# Patient Record
Sex: Female | Born: 1940 | Race: White | Hispanic: No | Marital: Single | Smoking: Former smoker
Health system: Southern US, Community
[De-identification: ages and names within clinical notes are randomized; demographics above are authoritative.]

## PROBLEM LIST (undated history)

## (undated) DIAGNOSIS — E785 Hyperlipidemia, unspecified: Secondary | ICD-10-CM

## (undated) DIAGNOSIS — I10 Essential (primary) hypertension: Secondary | ICD-10-CM

## (undated) DIAGNOSIS — Z9889 Other specified postprocedural states: Secondary | ICD-10-CM

## (undated) DIAGNOSIS — E119 Type 2 diabetes mellitus without complications: Secondary | ICD-10-CM

## (undated) DIAGNOSIS — C801 Malignant (primary) neoplasm, unspecified: Secondary | ICD-10-CM

## (undated) DIAGNOSIS — G4733 Obstructive sleep apnea (adult) (pediatric): Secondary | ICD-10-CM

## (undated) DIAGNOSIS — M1990 Unspecified osteoarthritis, unspecified site: Secondary | ICD-10-CM

## (undated) DIAGNOSIS — R112 Nausea with vomiting, unspecified: Secondary | ICD-10-CM

## (undated) DIAGNOSIS — I4891 Unspecified atrial fibrillation: Secondary | ICD-10-CM

## (undated) HISTORY — DX: Unspecified atrial fibrillation: I48.91

## (undated) HISTORY — DX: Essential (primary) hypertension: I10

## (undated) HISTORY — DX: Type 2 diabetes mellitus without complications: E11.9

## (undated) HISTORY — DX: Hyperlipidemia, unspecified: E78.5

## (undated) HISTORY — DX: Obstructive sleep apnea (adult) (pediatric): G47.33

---

## 1963-10-17 HISTORY — PX: APPENDECTOMY: SHX54

## 1990-10-16 HISTORY — PX: LUMBAR SPINE SURGERY: SHX701

## 2008-10-16 HISTORY — PX: CERVICAL SPINE SURGERY: SHX589

## 2013-10-16 DIAGNOSIS — I4891 Unspecified atrial fibrillation: Secondary | ICD-10-CM

## 2013-10-16 HISTORY — DX: Unspecified atrial fibrillation: I48.91

## 2015-10-17 DIAGNOSIS — G4733 Obstructive sleep apnea (adult) (pediatric): Secondary | ICD-10-CM

## 2015-10-17 HISTORY — DX: Obstructive sleep apnea (adult) (pediatric): G47.33

## 2018-06-03 ENCOUNTER — Ambulatory Visit (INDEPENDENT_AMBULATORY_CARE_PROVIDER_SITE_OTHER): Payer: Medicare Other | Admitting: Cardiovascular Disease

## 2018-06-03 ENCOUNTER — Encounter: Payer: Self-pay | Admitting: Cardiovascular Disease

## 2018-06-03 VITALS — BP 118/83 | HR 92 | Ht 67.0 in | Wt 216.8 lb

## 2018-06-03 DIAGNOSIS — E782 Mixed hyperlipidemia: Secondary | ICD-10-CM | POA: Diagnosis not present

## 2018-06-03 DIAGNOSIS — I481 Persistent atrial fibrillation: Secondary | ICD-10-CM | POA: Diagnosis not present

## 2018-06-03 DIAGNOSIS — I4811 Longstanding persistent atrial fibrillation: Secondary | ICD-10-CM

## 2018-06-03 DIAGNOSIS — E1169 Type 2 diabetes mellitus with other specified complication: Secondary | ICD-10-CM

## 2018-06-03 DIAGNOSIS — G4733 Obstructive sleep apnea (adult) (pediatric): Secondary | ICD-10-CM

## 2018-06-03 DIAGNOSIS — I10 Essential (primary) hypertension: Secondary | ICD-10-CM | POA: Diagnosis not present

## 2018-06-03 DIAGNOSIS — E669 Obesity, unspecified: Secondary | ICD-10-CM

## 2018-06-03 NOTE — Patient Instructions (Addendum)
Dr Sallyanne Kuster recommends that you schedule a follow-up appointment in 12 months. You will receive a reminder letter in the mail two months in advance. If you don't receive a letter, please call our office to schedule the follow-up appointment.  If you need a refill on your cardiac medications before your next appointment, please call your pharmacy.   Your physician recommends that you schedule a follow-up appointment with Dr Claiborne Billings first available [place].

## 2018-06-04 ENCOUNTER — Encounter: Payer: Self-pay | Admitting: Cardiovascular Disease

## 2018-06-04 DIAGNOSIS — E782 Mixed hyperlipidemia: Secondary | ICD-10-CM | POA: Insufficient documentation

## 2018-06-04 DIAGNOSIS — E1169 Type 2 diabetes mellitus with other specified complication: Secondary | ICD-10-CM | POA: Insufficient documentation

## 2018-06-04 DIAGNOSIS — G4733 Obstructive sleep apnea (adult) (pediatric): Secondary | ICD-10-CM | POA: Insufficient documentation

## 2018-06-04 DIAGNOSIS — E669 Obesity, unspecified: Secondary | ICD-10-CM

## 2018-06-04 DIAGNOSIS — I10 Essential (primary) hypertension: Secondary | ICD-10-CM | POA: Insufficient documentation

## 2018-06-04 DIAGNOSIS — I4811 Longstanding persistent atrial fibrillation: Secondary | ICD-10-CM | POA: Insufficient documentation

## 2018-06-04 NOTE — Progress Notes (Signed)
Cardiology Office Note:    Date:  06/04/2018   ID:  Colleen Mcgrath, DOB 04/13/1941, MRN [id number]  PCP:  Beckie Salts, MD  Cardiologist: New  Referring MD: No ref. provider found   Chief Complaint  Patient presents with  . Atrial Fibrillation  Establish cardiology care after moving to [facility]  History of Present Illness:    Colleen Mcgrath is a 77 y.o. female with a hx of long-standing persistent atrial fibrillation, obstructive sleep apnea, hypertension with left ventricular hypertrophy, mixed hyperlipidemia, type 2 diabetes mellitus, obesity, who recently moved here from [place].  We have not received her medical records, but cardiology results and office visit notes are available via care everywhere.  The cardiologist in [place] was Dr. Geralyn Corwin with [facility]  Her last office visit with him was in January 2019.  She was initially diagnosed with atrial fibrillation in 2015.  2 attempts at cardioversion were performed, both followed by early recurrence of arrhythmia, after which the decision was made to manage her with rate control alone.  She has been compliant with anticoagulation has not had bleeding problems.  She does not have a history of stroke, TIA or other embolic events.    - Echocardiogram in August 2018 showed preserved biventricular function, LVEF 60%, mild LVH, mild left atrial enlargement, "compared to the prior report, the MR is less" (prior report showed 1-2+ MR).  Carlton Adam Myoview performed October 2018 showed a very small and fixed distal anterior wall/apical defect that was felt to possibly represent attenuation artifact.  No reversible ischemia seen.   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 -Polysomnogram 2017 (2 studies in August and December, the actual report not available for  review).  The patient specifically denies any chest pain at rest or with exertion, dyspnea at rest or with exertion, orthopnea, paroxysmal nocturnal dyspnea, syncope, palpitations, focal neurological deficits, intermittent claudication, lower extremity edema, unexplained weight gain, cough, hemoptysis or wheezing.  The patient also denies abdominal pain, nausea, vomiting, dysphagia, diarrhea, constipation, polyuria, polydipsia, dysuria, hematuria, frequency, urgency, abnormal bleeding or bruising, fever, chills, unexpected weight changes, mood swings, change in skin or hair texture, change in voice quality, auditory or visual problems, allergic reactions or rashes, new musculoskeletal complaints other than usual "aches and pains".  She never became comfortable with wearing CPAP, although she does have the equipment.  She would like to focus on treatment for sleep apnea.  She does complain of fatigue, but it's not so clear that she has severe daytime hypersomnolence.  Past Medical History:  Diagnosis Date  . Atrial fibrillation ([place]) 2015  . Diabetes mellitus without complication ([place])   . Hyperlipidemia   . Hypertension   . OSA (obstructive sleep apnea) 2017    Past Surgical History:  Procedure Laterality Date  . APPENDECTOMY  1965  . CERVICAL SPINE SURGERY  2010  . LUMBAR SPINE SURGERY  1992    Current Medications: Current Meds  Medication Sig  . atenolol (TENORMIN) 50 MG tablet Take by mouth.  . Cholecalciferol (VITAMIN D-1000 MAX ST) 1000 units tablet Take by mouth.  Marland Kitchen glucose blood (CONTOUR TEST) test strip TEST ONCE DAILY  . Insulin Detemir (LEVEMIR FLEXTOUCH) 100 UNIT/ML Pen Inject into the skin.  . Insulin Pen Needle (BD PEN NEEDLE NANO U/F) 32G X 4 MM MISC Use 1 pen  needle daily.E11.9  . lisinopril (PRINIVIL,ZESTRIL) 5 MG tablet Take by mouth.  . loratadine (CLARITIN) 10 MG tablet Take by mouth.  . metoprolol tartrate (LOPRESSOR) 50 MG tablet Take by mouth.  . rivaroxaban  (XARELTO) 20 MG TABS tablet Take by mouth.     Allergies:   Cephalexin; Colesevelam hcl; Ezetimibe; Statins; Sulfa antibiotics; Cephalosporins; and Tetracyclines & related   Social History   Socioeconomic History  . Marital status: Single    Spouse name: Not on file  . Number of children: 2  . Years of education: 91  . Highest education level: Not on file  Occupational History  . Not on file  Social Needs  . Financial resource strain: Not on file  . Food insecurity:    Worry: Not on file    Inability: Not on file  . Transportation needs:    Medical: Not on file    Non-medical: Not on file  Tobacco Use  . Smoking status: Former Smoker    Last attempt to quit: 1999    Years since quitting: 20.6  . Smokeless tobacco: Never Used  Substance and Sexual Activity  . Alcohol use: Never    Frequency: Never  . Drug use: Never  . Sexual activity: Not on file  Lifestyle  . Physical activity:    Days per week: Not on file    Minutes per session: Not on file  . Stress: Not on file  Relationships  . Social connections:    Talks on phone: Not on file    Gets together: Not on file    Attends religious service: Not on file    Active member of club or organization: Not on file    Attends meetings of clubs or organizations: Not on file    Relationship status: Not on file  Other Topics Concern  . Not on file  Social History Narrative  . Not on file     Family History: The patient's family history includes Dementia in her mother; Heart disease in her father.  Her dad had onset of coronary problems at age 88, had bypass surgery, died 10 days later.  Her mother died of "old age" at 89  ROS:   Please see the history of present illness.     All other systems reviewed and are negative.  EKGs/Labs/Other Studies Reviewed:    The following studies were reviewed today: Multiple records including office visits, echo report, nuclear report, brain MRI, labs.  EKG:  EKG is  ordered today.   The ekg ordered today demonstrates atrial fibrillation with controlled ventricular response, otherwise normal tracing  Recent Labs: July 2019 hemoglobin A1c was 7.4%, creatinine 0.97, potassium 4.6, CO2 20, albumin 4.3, direct LDL 179 Recent Lipid Panel November 2018 total cholesterol 204, triglycerides 221, HDL 36, LDL 124  Physical Exam:    VS:  BP 118/83 (BP Location: Right Arm, Patient Position: Sitting, Cuff Size: Large)   Pulse 92   Ht 5\' 7"  (1.702 m)   Wt 216 lb 12.8 oz (98.3 kg)   BMI 33.96 kg/m     Wt Readings from Last 3 Encounters:  06/03/18 216 lb 12.8 oz (98.3 kg)     GEN: Mildly obese well nourished, well developed in no acute distress HEENT: Normal NECK: No JVD; No carotid bruits LYMPHATICS: No lymphadenopathy CARDIAC: Irregular, no murmurs, rubs, gallops RESPIRATORY:  Clear to auscultation without rales, wheezing or rhonchi  ABDOMEN: Soft, non-tender, non-distended MUSCULOSKELETAL:  No edema; No deformity  SKIN: Warm  and dry NEUROLOGIC:  Alert and oriented x 3 PSYCHIATRIC:  Normal affect   ASSESSMENT:    1. Longstanding persistent atrial fibrillation ([place])   2. Essential hypertension   3. Diabetes mellitus type 2 in obese ([place])   4. Mixed hyperlipidemia   5. OSA (obstructive sleep apnea)    PLAN:    In order of problems listed above:  1. AFib: Permanent arrhythmia and asymptomatic.  Well rate controlled on beta-blocker.  Appropriately anticoagulated. CHADSVasc 4 (age, gender, hypertension, diabetes).  No known history of embolic events.  Tolerating anticoagulation without bleeding events. 2. HTN: Well-controlled.  Will always need to be on beta-blocker for rate control he should be on ARB for diabetes. 3. DM: Improving control, although not quite yet at target. 4. HLP: She tells me that "she has been tried on every statin available".  Her LDL cholesterol is unacceptably high and she is at high risk for future cardiovascular complications based on the  data available so far.  On the other hand she tells me that she had some type of a cardiac scan with a score of 0, presumably a calcium score.  She does not recall exactly when this was done, but is probably been a long time.  We briefly discussed PCSK9 inhibitors, which have recently been approved for primary prevention.  I would strongly recommend she start these if she has even the slightest vascular event and will revisit the issue of primary prevention with her after we have a little more data.. 5. OSA: She had an abnormal polysomnogram but it sounds like her CPAP has never been adequately treated/titrated.  I recommended follow-up in sleep clinic with Dr. Claiborne Billings.  Weight loss is encouraged.   Medication Adjustments/Labs and Tests Ordered: Current medicines are reviewed at length with the patient today.  Concerns regarding medicines are outlined above.  No orders of the defined types were placed in this encounter.  No orders of the defined types were placed in this encounter.   Patient Instructions  Dr Sallyanne Kuster recommends that you schedule a follow-up appointment in 12 months. You will receive a reminder letter in the mail two months in advance. If you don't receive a letter, please call our office to schedule the follow-up appointment.  If you need a refill on your cardiac medications before your next appointment, please call your pharmacy.   Your physician recommends that you schedule a follow-up appointment with Dr Claiborne Billings first available [place].    Signed, Sanda Klein, MD  06/04/2018 7:21 PM    [place]

## 2018-07-04 ENCOUNTER — Telehealth: Payer: Self-pay | Admitting: Cardiovascular Disease

## 2018-07-04 NOTE — Telephone Encounter (Signed)
Received records from [facility] on 07/04/18, Appt 09/02/18 @ 10:40AM. [place]

## 2018-07-23 ENCOUNTER — Telehealth: Payer: Self-pay

## 2018-07-23 NOTE — Telephone Encounter (Signed)
   [facility] Pre-operative Risk Assessment    Request for surgical clearance:  1. What type of surgery is being performed? colonscopy   2. When is this surgery scheduled? pending   3. What type of clearance is required (medical clearance vs. Pharmacy clearance to hold med vs. Both)? pharmacy  4. Are there any medications that need to be held prior to surgery and how long? xarelto; 2-3 days   5. Practice name and name of physician performing surgery? [facility]; Dr Shana Chute   6. What is your office phone number? [phone]    7.   What is your office fax number? [phone]  8.   Anesthesia type (None, local, MAC, general) ? n/a   [place], [place] 07/23/2018, 5:51 PM  _________________________________________________________________

## 2018-07-24 NOTE — Telephone Encounter (Signed)
Patient with diagnosis of Afib on Xarelto for anticoagulation.    Procedure: colonoscopy Date of procedure: TBD  CHADS2-VASc score of 5 (CHF, HTN, AGE, DM2, stroke/tia x 2, CAD, AGE, female)  CrCl 76ml/min  Per office protocol, patient can hold Xarelto for 2 days prior to procedure.

## 2018-07-31 ENCOUNTER — Telehealth: Payer: Self-pay

## 2018-07-31 NOTE — Telephone Encounter (Signed)
   [facility] Pre-operative Risk Assessment    Request for surgical clearance:  1. What type of surgery is being performed? Colonoscopy  2. When is this surgery scheduled? TBD  3. What type of clearance is required (medical clearance vs. Pharmacy clearance to hold med vs. Both)? Pharmacy  4. Are there any medications that need to be held prior to surgery and how long? Xarelto  5. Practice name and name of physician performing surgery? [facility]   6. What is your office phone number [phone]   7.   What is your office fax number [phone]  8.   Anesthesia type (None, local, MAC, general) ? TBD   Colleen Mcgrath 07/31/2018, 2:12 PM  _________________________________________________________________   (provider comments below)

## 2018-08-02 NOTE — Telephone Encounter (Signed)
Pt takes Xarelto for afib with CHADS2VASc score of 5 (age x2, sex, HTN, DM). Renal function normal. Ok to hold Xarelto for 1-2 days prior to procedure.

## 2018-08-22 ENCOUNTER — Telehealth: Payer: Self-pay | Admitting: *Deleted

## 2018-08-22 NOTE — Telephone Encounter (Signed)
Left message to return a call. Need to find out where and when she had her sleep study.

## 2018-08-28 NOTE — Telephone Encounter (Signed)
Patient returned a call and informed me that she will bring a copy of her previous sleep study to her 09/02/18 appointment with Dr Claiborne Billings.

## 2018-09-02 ENCOUNTER — Encounter: Payer: Self-pay | Admitting: Cardiovascular Disease

## 2018-09-02 ENCOUNTER — Ambulatory Visit (INDEPENDENT_AMBULATORY_CARE_PROVIDER_SITE_OTHER): Payer: Medicare Other | Admitting: Cardiovascular Disease

## 2018-09-02 VITALS — BP 129/87 | HR 80 | Resp 16 | Ht 67.0 in | Wt 216.0 lb

## 2018-09-02 DIAGNOSIS — I10 Essential (primary) hypertension: Secondary | ICD-10-CM | POA: Diagnosis not present

## 2018-09-02 DIAGNOSIS — I4811 Longstanding persistent atrial fibrillation: Secondary | ICD-10-CM | POA: Diagnosis not present

## 2018-09-02 DIAGNOSIS — E782 Mixed hyperlipidemia: Secondary | ICD-10-CM

## 2018-09-02 DIAGNOSIS — E1169 Type 2 diabetes mellitus with other specified complication: Secondary | ICD-10-CM

## 2018-09-02 DIAGNOSIS — Z6833 Body mass index (BMI) 33.0-33.9, adult: Secondary | ICD-10-CM

## 2018-09-02 DIAGNOSIS — G4733 Obstructive sleep apnea (adult) (pediatric): Secondary | ICD-10-CM

## 2018-09-02 DIAGNOSIS — E6609 Other obesity due to excess calories: Secondary | ICD-10-CM

## 2018-09-02 DIAGNOSIS — E669 Obesity, unspecified: Secondary | ICD-10-CM

## 2018-09-02 NOTE — Patient Instructions (Signed)
Follow-Up: At [facility], you and your health needs are our priority.  As part of our continuing mission to provide you with exceptional heart care, we have created designated Provider Care Teams.  These Care Teams include your primary Cardiologist (physician) and Advanced Practice Providers (APPs -  Physician Assistants and Nurse Practitioners) who all work together to provide you with the care you need, when you need it. Marland Kitchen 2-3 months with Dr. Claiborne Billings (sleep clinic)

## 2018-09-03 NOTE — Progress Notes (Addendum)
Cardiology Office Note    Date:  09/08/2018   ID:  Colleen Mcgrath, DOB 12/31/1940, MRN [id number]  PCP:  Beckie Salts, MD  Cardiologist:  Shelva Majestic, MD (sleep); Dr.Croituro  Chief Complaint  Patient presents with  . Sleep Apnea   New Sleep evaluation  History of Present Illness:  Colleen Mcgrath is a 77 y.o. female referred by Dr. Sallyanne Kuster for sleep evaluation.  She had recently moved from the [place] area to [place].  Smoker has a history of long-standing persistent atrial fibrillation, hypertension with left ventricular hypertrophy, mixed hyperlipidemia, type 2 diabetes mellitus, and obesity.  Newly diagnosed with atrial fibrillation in 2005 and had undergone 2 attempts at cardioversion remotely reverted back to atrial fibrillation.  Currently, in 2017 she was diagnosed with severe sleep apnea and has been on CPAP therapy September 2017.  Apparently she had "struggled "with her mask which is a full facemask.  Seen her actual sleep study have reviewed office notes from [facility], [place] from August 29,2017 [facility].  Earlier overnight polysomnogram was performed on June 11, 2016 which revealed severe sleep apnea with an apnea hypotony index of 47.8/h and respiratory disturbance index of 55.9/h.  She had oxygen desaturation to 88%.  Is having symptoms of snoring, decreased memory, decreased concentration, falling asleep while watching television, awakening in the middle of night because of urination, increasing weight, hypersomnolence during the day with frequent naps.  Her initial Epworth score was 12 consistent with excessive daytime sleepiness.  CPAP was recommended she initiated therapy but has had some difficulty with treatment and continues to have issues as I am certain if she is being adequately treated.  He moved to this area and recently establish cardiology care with Dr. Sallyanne Kuster.  She now presents for further evaluation of her sleep apnea will need a referral to a local DME  for supplies.  She typically goes to bed at 11 PM and wakes up between 7 and 8 AM.  She is currently waking up at least one time per night.  An Epworth Sleepiness Scale score was calculated in the office today and this endorsed at 3.   Past Medical History:  Diagnosis Date  . Atrial fibrillation ([place]) 2015  . Diabetes mellitus without complication ([place])   . Hyperlipidemia   . Hypertension   . OSA (obstructive sleep apnea) 2017    Past Surgical History:  Procedure Laterality Date  . APPENDECTOMY  1965  . CERVICAL SPINE SURGERY  2010  . LUMBAR SPINE SURGERY  1992    Current Medications: Outpatient Medications Prior to Visit  Medication Sig Dispense Refill  . Cholecalciferol (VITAMIN D-1000 MAX ST) 1000 units tablet Take by mouth.    . fenofibrate 160 MG tablet TAKE 1 TABLET BY MOUTH EACH NIGHT AT BEDTIME  5  . glucose blood (CONTOUR TEST) test strip TEST ONCE DAILY    . Insulin Detemir (LEVEMIR FLEXTOUCH) 100 UNIT/ML Pen Inject into the skin.    . Insulin Pen Needle (BD PEN NEEDLE NANO U/F) 32G X 4 MM MISC Use 1 pen needle daily.E11.9    . lisinopril (PRINIVIL,ZESTRIL) 5 MG tablet Take by mouth.    . loratadine (CLARITIN) 10 MG tablet Take by mouth.    . metFORMIN (GLUCOPHAGE-XR) 500 MG 24 hr tablet Take 500 mg by mouth 2 (two) times daily.     . metoprolol tartrate (LOPRESSOR) 50 MG tablet Take by mouth.    . rivaroxaban (XARELTO) 20 MG TABS tablet Take by mouth.    Marland Kitchen  atenolol (TENORMIN) 50 MG tablet Take by mouth.     No facility-administered medications prior to visit.      Allergies:   Cephalexin; Colesevelam hcl; Ezetimibe; Statins; Sulfa antibiotics; Cephalosporins; and Tetracyclines & related   Social History   Socioeconomic History  . Marital status: Single    Spouse name: Not on file  . Number of children: 2  . Years of education: 33  . Highest education level: Not on file  Occupational History  . Not on file  Social Needs  . Financial resource strain: Not on  file  . Food insecurity:    Worry: Not on file    Inability: Not on file  . Transportation needs:    Medical: Not on file    Non-medical: Not on file  Tobacco Use  . Smoking status: Former Smoker    Last attempt to quit: 1999    Years since quitting: 20.9  . Smokeless tobacco: Never Used  Substance and Sexual Activity  . Alcohol use: Never    Frequency: Never  . Drug use: Never  . Sexual activity: Not on file  Lifestyle  . Physical activity:    Days per week: Not on file    Minutes per session: Not on file  . Stress: Not on file  Relationships  . Social connections:    Talks on phone: Not on file    Gets together: Not on file    Attends religious service: Not on file    Active member of club or organization: Not on file    Attends meetings of clubs or organizations: Not on file    Relationship status: Not on file  Other Topics Concern  . Not on file  Social History Narrative  . Not on file    Socially, she is divorced, single, and lives with her cat area.  Remote tobacco history but she quit smoking in August 2003.  Family History:  The patient's family history includes Dementia in her mother; Heart disease in her father.  Mother suffered a TIA.  Her father had CAD, hyperlipidemia, diabetes, and underwent CABG revascularization surgery.  Her sister has hyperlipidemia.  ROS General: Negative; No fevers, chills, or night sweats;  HEENT: Negative; No changes in vision or hearing, sinus congestion, difficulty swallowing Pulmonary: Negative; No cough, wheezing, shortness of breath, hemoptysis Cardiovascular: chronic atrial fibrillation GI: Negative; No nausea, vomiting, diarrhea, or abdominal pain GU: Negative; No dysuria, hematuria, or difficulty voiding Musculoskeletal: Negative; no myalgias, joint pain, or weakness Hematologic/Oncology: Negative; no easy bruising, bleeding Endocrine: Negative; no heat/cold intolerance; no diabetes Neuro: History of meningioma Skin:  Negative; No rashes or skin lesions Psychiatric: Negative; No behavioral problems, depression Sleep: see HPI  other comprehensive 14 point system review is negative.   PHYSICAL EXAM:   VS:  BP 129/87   Pulse 80   Resp 16   Ht 5\' 7"  (1.702 m)   Wt 216 lb (98 kg)   SpO2 98%   BMI 33.83 kg/m     Repeat blood pressure by me was 114/74  Wt Readings from Last 3 Encounters:  09/02/18 216 lb (98 kg)  06/03/18 216 lb 12.8 oz (98.3 kg)    General: Alert, oriented, no distress.  Mild obesity Skin: normal turgor, no rashes, warm and dry HEENT: Normocephalic, atraumatic. Pupils equal round and reactive to light; sclera anicteric; extraocular muscles intact; Fundi which is or exudates Nose without nasal septal hypertrophy Mouth/Parynx benign; Mallinpatti scale 3 Neck: No JVD, no carotid  bruits; normal carotid upstroke Lungs: clear to ausculatation and percussion; no wheezing or rales Chest wall: without tenderness to palpitation Heart: PMI not displaced, RRR, s1 s2 normal, 1/6 systolic murmur, no diastolic murmur, no rubs, gallops, thrills, or heaves Abdomen: soft, nontender; no hepatosplenomehaly, BS+; abdominal aorta nontender and not dilated by palpation. Back: no CVA tenderness Pulses 2+ Musculoskeletal: full range of motion, normal strength, no joint deformities Extremities: no clubbing cyanosis or edema, Homan's sign negative  Neurologic: grossly nonfocal; Cranial nerves grossly wnl Psychologic: Normal mood and affect   Studies/Labs Reviewed:   EKG:  EKG is ordered today. ECG (independently read by me): Normal sinus rhythm with sinus arrhythmia.  Ventricular rate 74.  QTc interval 4 6 ms.  Recent Labs: No flowsheet data found.   No flowsheet data found.  No flowsheet data found. No results found for: MCV No results found for: TSH No results found for: HGBA1C   BNP No results found for: BNP  ProBNP No results found for: PROBNP   Lipid Panel  No results found  for: CHOL, TRIG, HDL, CHOLHDL, VLDL, LDLCALC, LDLDIRECT   RADIOLOGY: No results found.   Additional studies/ records that were reviewed today include:  I reviewed the patient's medical records from [place] from [place] and local records from Dr. Sallyanne Kuster.    ASSESSMENT:    1. OSA (obstructive sleep apnea)   2. Longstanding persistent atrial fibrillation   3. Essential hypertension   4. Diabetes mellitus type 2 in obese ([place])   5. Mixed hyperlipidemia   6. Class 1 obesity due to excess calories with serious comorbidity and body mass index (BMI) of 33.0 to 33.9 in adult     PLAN:  Ms. Emmalynn Pinkham is a very pleasant 77 year old female who has a history of tanning persistent atrial fibrillation with recurrence despite 2 prior cardioversion attempts, hypertension with documented left ventricular hypertrophy, type 2 diabetes mellitus, mixed hyperlipidemia, on anticoagulation therapy.  Due to significant complaints of snoring, gasping for breath, times sleepiness, fatigue, and nocturia, she was referred for sleep study which was done in August 2017.  She was found to have severe sleep apnea currently underwent subsequent CPAP titration.  I do not have the records of her CPAP titration and did not know her current pressures.  She is now moved to the [place] area and is living in [place].  She has continued to use her CPAP but admits to fatigability and may have breakthrough snoring and still experiences some occasional nocturia.  She did not bring her machine with her today and we will need to set her up with a new DME company but supplies.  She will need to contact her company so that can link her CPAP which will allow [place] to obtain download information from her current ResMed AirSense 10 CPAP unit. I  reviewed the importance of CPAP therapy particularly with reference to her cardiovascular health potential implications if her sleep apnea is untreated.  We discussed compliance and  optimal sleep duration 8 hours of CPAP use ideally night.  Once we are able to obtain download information, adjustments can be made accordingly.  I will see her back in the office in 2 months for reevaluation.  Medication Adjustments/Labs and Tests Ordered: Current medicines are reviewed at length with the patient today.  Concerns regarding medicines are outlined above.  Medication changes, Labs and Tests ordered today are listed in the Patient Instructions below. Patient Instructions  Follow-Up: At [facility], you and your  health needs are our priority.  As part of our continuing mission to provide you with exceptional heart care, we have created designated Provider Care Teams.  These Care Teams include your primary Cardiologist (physician) and Advanced Practice Providers (APPs -  Physician Assistants and Nurse Practitioners) who all work together to provide you with the care you need, when you need it. Marland Kitchen 2-3 months with Dr. Claiborne Billings (sleep clinic)       Signed, Shelva Majestic, MD  09/08/2018 8:57 PM    [place] [address], [place], [place], [place]  [address] Phone: [phone]

## 2018-09-06 ENCOUNTER — Telehealth: Payer: Self-pay | Admitting: *Deleted

## 2018-09-06 NOTE — Telephone Encounter (Signed)
Left message that I had gotten a message that she wanted me to call her to discuss her ? CPAP.

## 2018-09-08 ENCOUNTER — Encounter: Payer: Self-pay | Admitting: Cardiovascular Disease

## 2018-10-02 ENCOUNTER — Telehealth: Payer: Self-pay | Admitting: *Deleted

## 2018-10-02 NOTE — Telephone Encounter (Signed)
CPAP referral sent to [facility].

## 2018-10-30 ENCOUNTER — Other Ambulatory Visit: Payer: Self-pay | Admitting: Internal Medicine

## 2018-10-30 DIAGNOSIS — N6310 Unspecified lump in the right breast, unspecified quadrant: Secondary | ICD-10-CM

## 2018-11-07 ENCOUNTER — Ambulatory Visit
Admission: RE | Admit: 2018-11-07 | Discharge: 2018-11-07 | Disposition: A | Discharge status: Home / Self Care | Payer: Medicare Other | Source: Ambulatory Visit | Attending: Internal Medicine | Admitting: Internal Medicine

## 2018-11-07 DIAGNOSIS — N6310 Unspecified lump in the right breast, unspecified quadrant: Secondary | ICD-10-CM

## 2019-01-02 ENCOUNTER — Ambulatory Visit: Payer: Medicare Other | Admitting: Cardiovascular Disease

## 2019-02-25 ENCOUNTER — Telehealth: Payer: Self-pay | Admitting: Cardiovascular Disease

## 2019-02-25 NOTE — Telephone Encounter (Signed)
Patient has some questions regarding cpap supplies.

## 2019-02-27 ENCOUNTER — Telehealth: Payer: Medicare Other | Admitting: Cardiovascular Disease

## 2019-05-21 ENCOUNTER — Ambulatory Visit: Payer: Medicare Other | Admitting: Cardiovascular Disease

## 2019-08-05 ENCOUNTER — Ambulatory Visit (INDEPENDENT_AMBULATORY_CARE_PROVIDER_SITE_OTHER): Payer: Medicare Other | Admitting: Cardiovascular Disease

## 2019-08-05 ENCOUNTER — Other Ambulatory Visit: Payer: Self-pay

## 2019-08-05 ENCOUNTER — Encounter: Payer: Self-pay | Admitting: Cardiovascular Disease

## 2019-08-05 VITALS — BP 114/72 | HR 97 | Ht 67.0 in | Wt 211.4 lb

## 2019-08-05 DIAGNOSIS — Z7901 Long term (current) use of anticoagulants: Secondary | ICD-10-CM | POA: Diagnosis not present

## 2019-08-05 DIAGNOSIS — G4733 Obstructive sleep apnea (adult) (pediatric): Secondary | ICD-10-CM

## 2019-08-05 DIAGNOSIS — E1169 Type 2 diabetes mellitus with other specified complication: Secondary | ICD-10-CM

## 2019-08-05 DIAGNOSIS — I10 Essential (primary) hypertension: Secondary | ICD-10-CM | POA: Diagnosis not present

## 2019-08-05 DIAGNOSIS — E669 Obesity, unspecified: Secondary | ICD-10-CM

## 2019-08-05 DIAGNOSIS — I4821 Permanent atrial fibrillation: Secondary | ICD-10-CM

## 2019-08-05 DIAGNOSIS — E782 Mixed hyperlipidemia: Secondary | ICD-10-CM

## 2019-08-05 NOTE — Patient Instructions (Signed)
Medication Instructions:  No changes *If you need a refill on your cardiac medications before your next appointment, please call your pharmacy*  Lab Work: None ordered If you have labs (blood work) drawn today and your tests are completely normal, you will receive your results only by: . MyChart Message (if you have MyChart) OR . A paper copy in the mail If you have any lab test that is abnormal or we need to change your treatment, we will call you to review the results.  Testing/Procedures: None ordered  Follow-Up: At [facility], you and your health needs are our priority.  As part of our continuing mission to provide you with exceptional heart care, we have created designated Provider Care Teams.  These Care Teams include your primary Cardiologist (physician) and Advanced Practice Providers (APPs -  Physician Assistants and Nurse Practitioners) who all work together to provide you with the care you need, when you need it.  Your next appointment:   12 month(s)  The format for your next appointment:   Either In Person or Virtual  Provider:   Mihai Croitoru, MD    

## 2019-08-05 NOTE — Progress Notes (Signed)
Cardiology Office Note:    Date:  08/06/2019   ID:  Fredrich Romans, DOB 08-Aug-1941, MRN [id number]  PCP:  Beckie Salts, MD  Cardiologist: New  Referring MD: Beckie Salts, MD   No chief complaint on file. Establish cardiology care after moving to [facility]  History of Present Illness:    Colleen Mcgrath is a 78 y.o. female with a hx of long-standing persistent atrial fibrillation, obstructive sleep apnea, hypertension with left ventricular hypertrophy, mixed hyperlipidemia, type 2 diabetes mellitus, obesity, who recently moved here from [place].  She generally feels well and has not had any new serious health problems since her last appointment a year ago.  The patient specifically denies any chest pain at rest exertion, dyspnea at rest or with exertion, orthopnea, paroxysmal nocturnal dyspnea, syncope, palpitations, focal neurological deficits, intermittent claudication, lower extremity edema, unexplained weight gain, cough, hemoptysis or wheezing.   Her cardiologist in [place] was Dr. Geralyn Corwin with [facility].  Her last office visit with him was in January 2019. She was initially diagnosed with atrial fibrillation in 2015.  2 attempts at cardioversion were performed, both followed by early recurrence of arrhythmia, after which the decision was made to manage her with rate control alone.  She was diagnosed with obstructive sleep apnea but is intolerant of CPAP   - Echocardiogram in August 2018 showed preserved biventricular function, LVEF 60%, mild LVH, mild left atrial enlargement, "compared to the prior report, the MR is less" (prior report showed 1-2+ MR).  Carlton Adam Myoview performed October 2018 showed a very small and fixed distal anterior wall/apical defect that was felt to possibly represent attenuation artifact.  No reversible ischemia seen.   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 -Polysomnogram 2017 (2 studies in August and December, the actual report not available for review).   Past Medical History:  Diagnosis Date  . Atrial fibrillation ([place]) 2015  . Diabetes mellitus without complication ([place])   . Hyperlipidemia   . Hypertension   . OSA (obstructive sleep apnea) 2017    Past Surgical History:  Procedure Laterality Date  . APPENDECTOMY  1965  . CERVICAL SPINE SURGERY  2010  . LUMBAR SPINE SURGERY  1992    Current Medications: Current Meds  Medication Sig  . Cholecalciferol (VITAMIN D-1000 MAX ST) 1000 units tablet Take by mouth.  . fenofibrate 160 MG tablet TAKE 1 TABLET BY MOUTH EACH NIGHT AT BEDTIME  . glucose blood (CONTOUR TEST) test strip TEST ONCE DAILY  . Insulin Detemir (LEVEMIR FLEXTOUCH) 100 UNIT/ML Pen Inject into the skin.  . Insulin Pen Needle (BD PEN NEEDLE NANO U/F) 32G X 4 MM MISC Use 1 pen needle daily.E11.9  . lisinopril (PRINIVIL,ZESTRIL) 5 MG tablet Take by mouth.  . loratadine (CLARITIN) 10 MG tablet Take by mouth.  . metFORMIN (GLUCOPHAGE-XR) 500 MG 24 hr tablet Take 500 mg by mouth 2 (two) times daily.   . metoprolol tartrate (LOPRESSOR) 50 MG tablet Take by mouth.  . rivaroxaban (XARELTO) 20 MG TABS tablet Take by mouth.     Allergies:   Cephalexin, Colesevelam hcl, Ezetimibe, Statins, Sulfa antibiotics, Cephalosporins, and Tetracyclines & related   Social History   Socioeconomic History  . Marital status: Single    Spouse name: Not on file  . Number of children: 2  . Years of education:  15  . Highest education level: Not on file  Occupational History  . Not on file  Social Needs  . Financial resource strain: Not on file  . Food insecurity    Worry: Not on file    Inability: Not on file  . Transportation needs    Medical: Not on file    Non-medical: Not on file  Tobacco Use  . Smoking status: Former Smoker    Quit date: 1999    Years  since quitting: 21.8  . Smokeless tobacco: Never Used  Substance and Sexual Activity  . Alcohol use: Never    Frequency: Never  . Drug use: Never  . Sexual activity: Not on file  Lifestyle  . Physical activity    Days per week: Not on file    Minutes per session: Not on file  . Stress: Not on file  Relationships  . Social Herbalist on phone: Not on file    Gets together: Not on file    Attends religious service: Not on file    Active member of club or organization: Not on file    Attends meetings of clubs or organizations: Not on file    Relationship status: Not on file  Other Topics Concern  . Not on file  Social History Narrative  . Not on file     Family History: The patient's family history includes Dementia in her mother; Heart disease in her father.  Her dad had onset of coronary problems at age 36, had bypass surgery, died 10 days later.  Her mother died of "old age" at 37  ROS:   Please see the history of present illness.     All other systems reviewed and are negative.  EKGs/Labs/Other Studies Reviewed:    The following studies were reviewed today:   EKG:  EKG is  ordered today.  The ekg ordered today shows atrial fibrillation with a ventricular of 97 bpm, generalized low voltage and nonspecific ST-T wave changes. Recent Labs: June 18, 2019  creatinine 1.2, potassium 4.5, hemoglobin 13.8, normal liver function tests Recent Lipid Panel June 18, 2019 total cholesterol 202, triglycerides 182, HDL 40, LDL 126  Physical Exam:    VS:  BP 114/72   Pulse 97   Ht 5\' 7"  (1.702 m)   Wt 211 lb 6.4 oz (95.9 kg)   BMI 33.11 kg/m     Wt Readings from Last 3 Encounters:  08/05/19 211 lb 6.4 oz (95.9 kg)  09/02/18 216 lb (98 kg)  06/03/18 216 lb 12.8 oz (98.3 kg)     General: Alert, oriented x3, no distress, Lee obese Head: no evidence of trauma, PERRL, EOMI, no exophtalmos or lid lag, no myxedema, no xanthelasma; normal ears, nose and oropharynx  Neck: normal jugular venous pulsations and no hepatojugular reflux; brisk carotid pulses without delay and no carotid bruits Chest: clear to auscultation, no signs of consolidation by percussion or palpation, normal fremitus, symmetrical and full respiratory excursions Cardiovascular: normal position and quality of the apical impulse, irregular rhythm, normal first and second heart sounds, no murmurs, rubs or gallops Abdomen: no tenderness or distention, no masses by palpation, no abnormal pulsatility or arterial bruits, normal bowel sounds, no hepatosplenomegaly Extremities: no clubbing, cyanosis or edema; 2+ radial, ulnar and brachial pulses bilaterally; 2+ right femoral, posterior tibial and dorsalis pedis pulses; 2+ left femoral, posterior tibial and dorsalis pedis pulses; no subclavian or femoral bruits Neurological: grossly nonfocal Psych: Normal mood and affect  ASSESSMENT:    1. Permanent atrial fibrillation ([place])   2. Essential hypertension   3. Long term current use of anticoagulant   4. Diabetes mellitus type 2 in obese ([place])   5. Mixed hyperlipidemia   6. OSA (obstructive sleep apnea)    PLAN:    In order of problems listed above:  1. AFib: Permanent arrhythmia, asymptomatic, compliant with anticoagulation. CHADSVasc 3 (age, gender, hypertension, diabetes).  No known stroke or TIA or other embolic events in the past. 2. Anticoagulation: Well-tolerated, no bleeding complications. 3. HTN: Excellent control.  Beta-blockers for rate control and ACE inhibitor or ARB for renal function protection are the best choices. 4. DM: She reports good control but I do not have her most recent hemoglobin A1c available for review 5. HLP: She tells me that "she has been tried on every statin available".  Her LDL cholesterol is unacceptably high and she is at high risk for future cardiovascular complications based on the data available so far.  On the other hand she tells me that she had some  type of a cardiac scan with a score of 0, presumably a calcium score.  She does not recall exactly when this was done, but is probably been a long time.  We briefly discussed PCSK9 inhibitors, which have recently been approved for primary prevention.  I would strongly recommend she start these if she has even the slightest vascular event and will revisit the issue of primary prevention with her after we have a little more data.. 6. OSA: Did not tolerate CPAP.  Reports that she wakes up feeling rested and denies symptoms of daytime hypersomnolence.  Does not want to try CPAP again.   Medication Adjustments/Labs and Tests Ordered: Current medicines are reviewed at length with the patient today.  Concerns regarding medicines are outlined above.  Orders Placed This Encounter  Procedures  . EKG 12-Lead   No orders of the defined types were placed in this encounter.   Patient Instructions  Medication Instructions:  No changes *If you need a refill on your cardiac medications before your next appointment, please call your pharmacy*  Lab Work: None ordered If you have labs (blood work) drawn today and your tests are completely normal, you will receive your results only by: Marland Kitchen MyChart Message (if you have MyChart) OR . A paper copy in the mail If you have any lab test that is abnormal or we need to change your treatment, we will call you to review the results.  Testing/Procedures: None ordered  Follow-Up: At [facility], you and your health needs are our priority.  As part of our continuing mission to provide you with exceptional heart care, we have created designated Provider Care Teams.  These Care Teams include your primary Cardiologist (physician) and Advanced Practice Providers (APPs -  Physician Assistants and Nurse Practitioners) who all work together to provide you with the care you need, when you need it.  Your next appointment:   12 months  The format for your next appointment:    Either In Person or Virtual  Provider:   Sanda Klein, MD     Signed, Sanda Klein, MD  08/06/2019 5:53 PM    [place]

## 2019-10-29 ENCOUNTER — Ambulatory Visit: Payer: Medicare Other | Attending: Internal Medicine

## 2019-10-29 DIAGNOSIS — Z23 Encounter for immunization: Secondary | ICD-10-CM

## 2019-10-29 NOTE — Progress Notes (Signed)
   Covid-19 Vaccination Clinic  Name:  Colleen Mcgrath    MRN: [id number] DOB: [birth year]  10/29/2019  [name] was observed post Covid-19 immunization for 30 minutes based on pre-vaccination screening without incidence. She was provided with Vaccine Information Sheet and instruction to access the V-Safe system.   [name] was instructed to call 911 with any severe reactions post vaccine: Marland Kitchen Difficulty breathing  . Swelling of your face and throat  . A fast heartbeat  . A bad rash all over your body  . Dizziness and weakness    Immunizations Administered    Name Date Dose VIS Date Route   Pfizer COVID-19 Vaccine 10/29/2019 10:17 AM 0.3 mL 09/26/2019 Intramuscular   Manufacturer: [employer]   Lot: [id number]   [place]: [id number]

## 2019-11-18 ENCOUNTER — Ambulatory Visit: Payer: Medicare Other | Attending: Internal Medicine

## 2019-11-18 DIAGNOSIS — Z23 Encounter for immunization: Secondary | ICD-10-CM | POA: Insufficient documentation

## 2019-11-18 NOTE — Progress Notes (Signed)
   Covid-19 Vaccination Clinic  Name:  Mihira Peyer    MRN: [id number] DOB: 1941/04/15  11/18/2019  Ms. Michelini was observed post Covid-19 immunization for 15 minutes without incidence. She was provided with Vaccine Information Sheet and instruction to access the V-Safe system.   Ms. Beatie was instructed to call 911 with any severe reactions post vaccine: Marland Kitchen Difficulty breathing  . Swelling of your face and throat  . A fast heartbeat  . A bad rash all over your body  . Dizziness and weakness    Immunizations Administered    Name Date Dose VIS Date Route   Pfizer COVID-19 Vaccine 11/18/2019  9:13 AM 0.3 mL 09/26/2019 Intramuscular   Manufacturer: [place]   Lot: [id number]   [place]: [id number]

## 2020-01-09 ENCOUNTER — Other Ambulatory Visit: Payer: Self-pay | Admitting: Cardiovascular Disease

## 2020-01-09 NOTE — Telephone Encounter (Signed)
*  STAT* If patient is at the pharmacy, call can be transferred to refill team.   1. Which medications need to be refilled? (please list name of each medication and dose if known)  Need a new prescription for Xarelto   2. Which pharmacy/location (including street and city if local pharmacy) is medication to be sent to? [facility]- [phone]  3. Do they need a 30 day or 90 day supply? 90 days and refills

## 2020-01-12 ENCOUNTER — Telehealth: Payer: Self-pay | Admitting: Cardiovascular Disease

## 2020-01-12 MED ORDER — RIVAROXABAN 20 MG PO TABS: 20.0000 mg | ORAL_TABLET | Freq: Every day | ORAL | 1 refills | Status: AC

## 2020-01-12 NOTE — Telephone Encounter (Signed)
*  STAT* If patient is at the pharmacy, call can be transferred to refill team.   1. Which medications need to be refilled? (please list name of each medication and dose if known) rivaroxaban (XARELTO) 20 MG TABS tablet  2. Which pharmacy/location (including street and city if local pharmacy) is medication to be sent to? [id number] [place] [place] - [address]OSS [address]  3. Do they need a 30 day or 90 day supply? [place]

## 2021-01-20 IMAGING — MG STEREOTACTIC VACUUM ASSIST RIGHT
7 series · 8 of 23 positions shown · non-contrast
Comparison: Previous exams.

Addendum:
CLINICAL DATA: Right breast mass.

EXAM:
RIGHT BREAST STEREOTACTIC CORE NEEDLE BIOPSY

[R (1 of 2)]
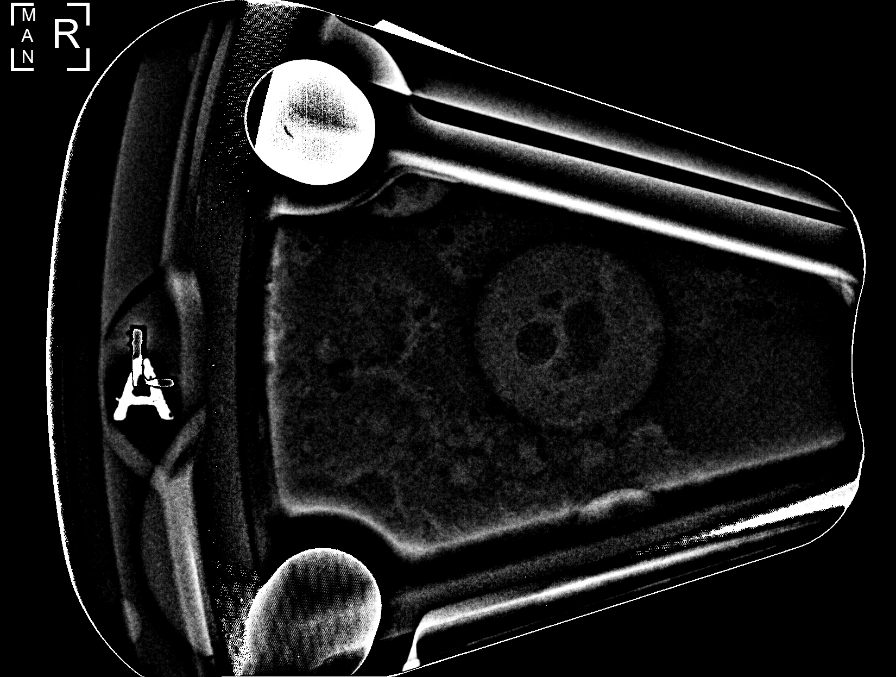

[R (2 of 2)]
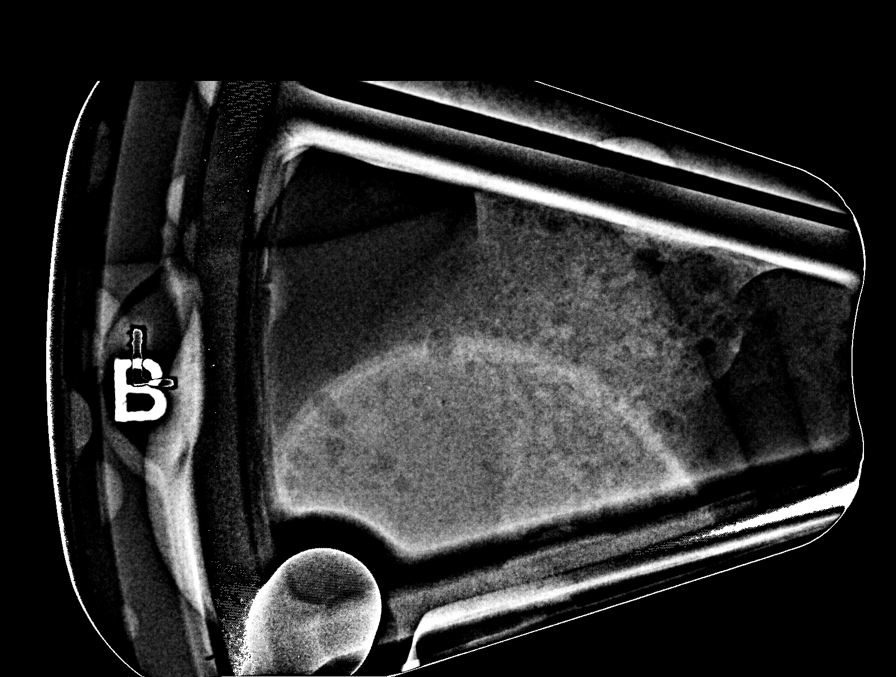

[R ML]
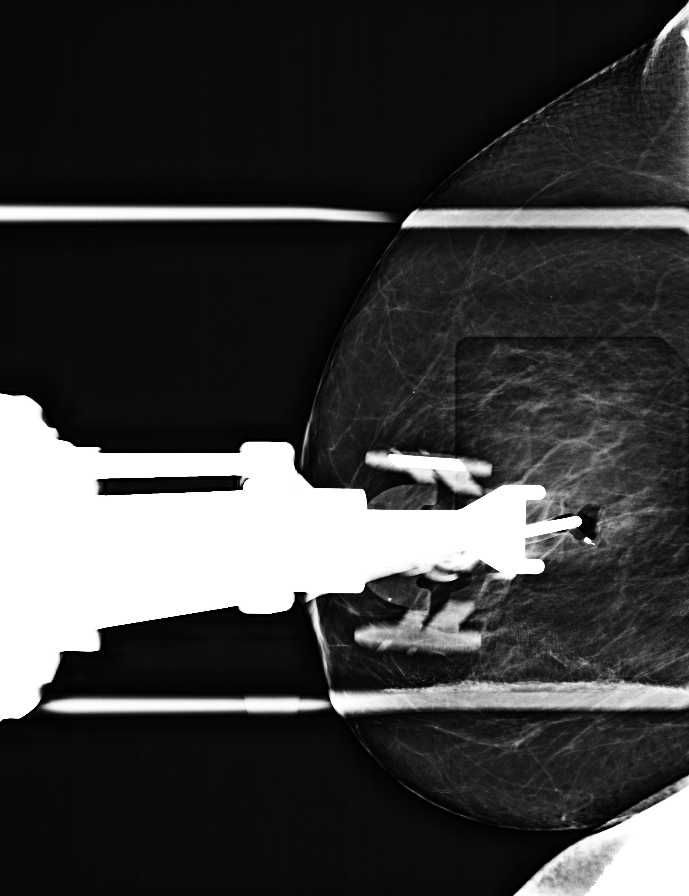

[R ML tomo · 2 of 78 frames shown (1 of 4)]
[frame 26/78]
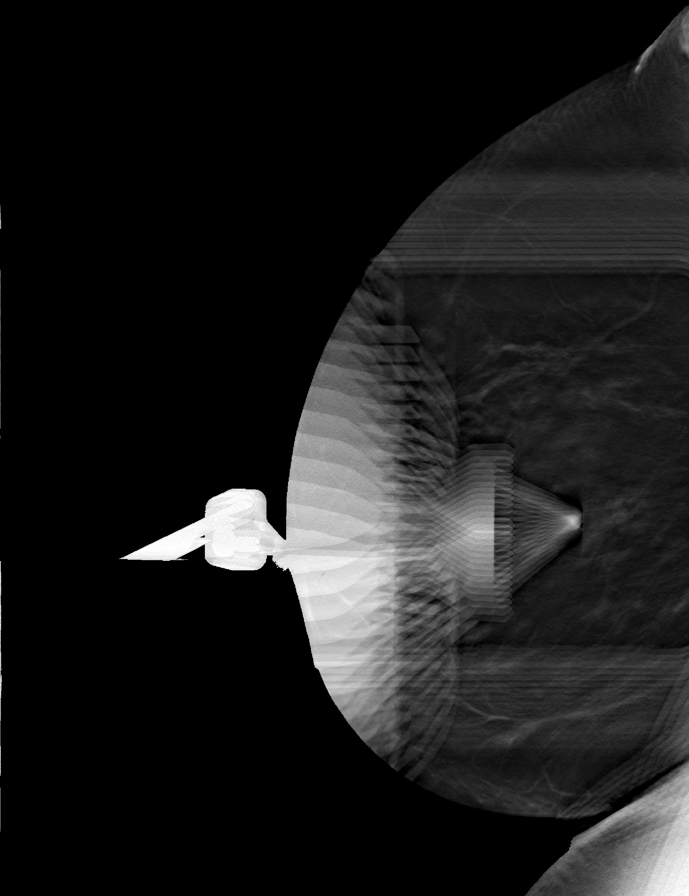
[frame 39/78]
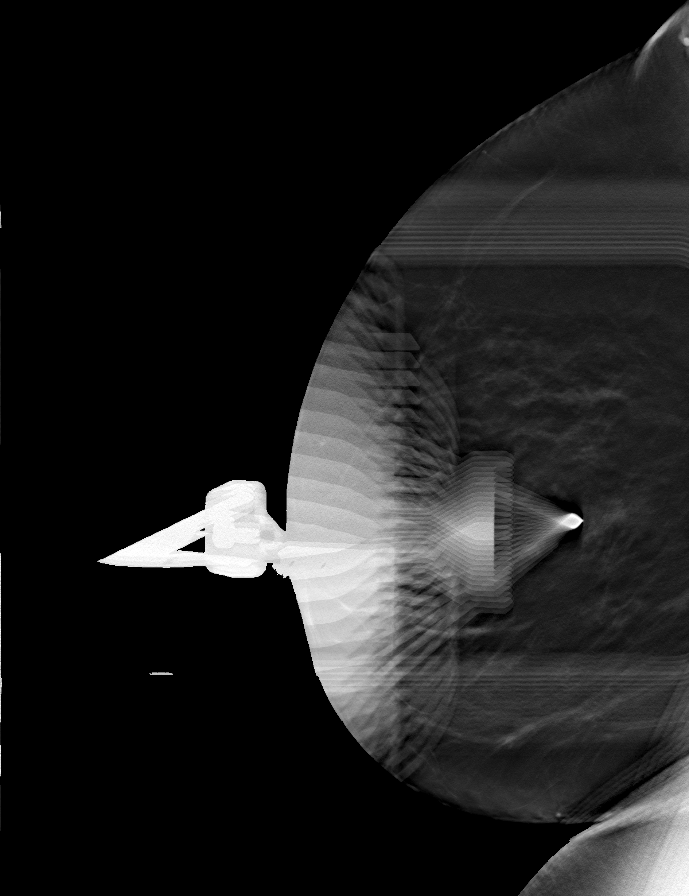

[R ML tomo (2 of 4) · tomo slice 39/78.0]
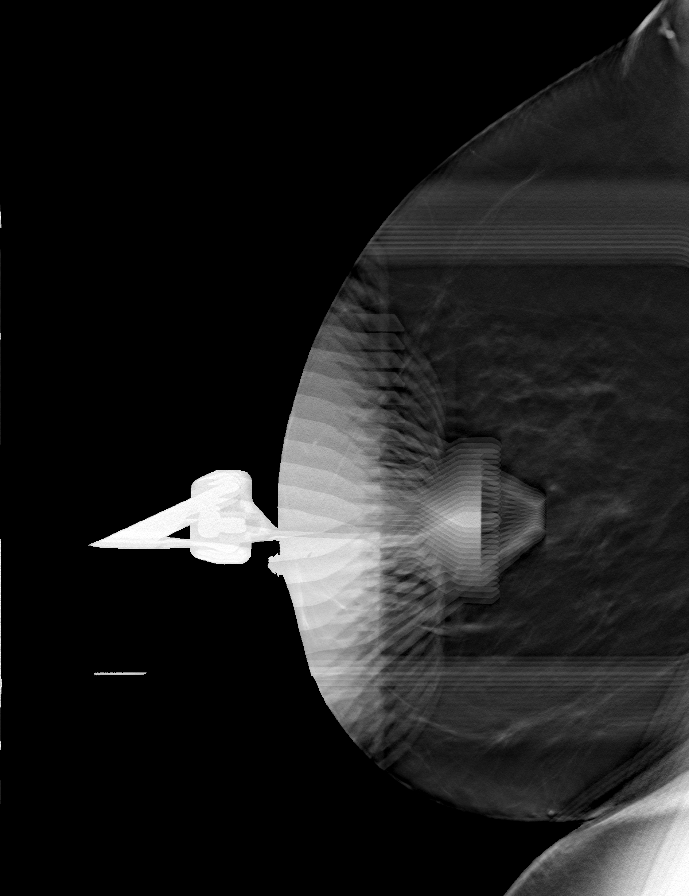

[R ML tomo (3 of 4) · tomo slice 39/78.0]
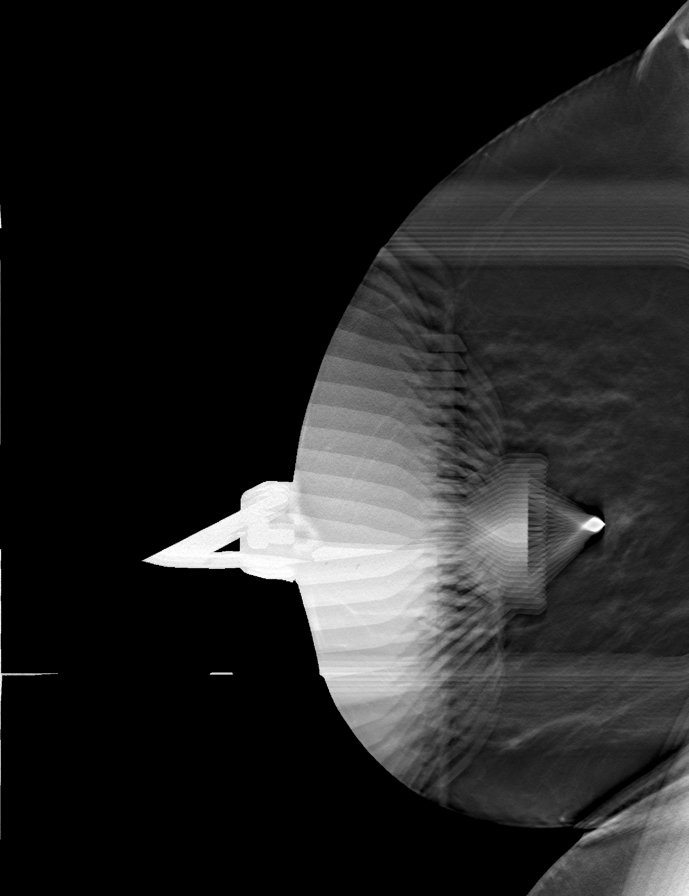

[R ML tomo (4 of 4) · tomo slice 39/78.0]
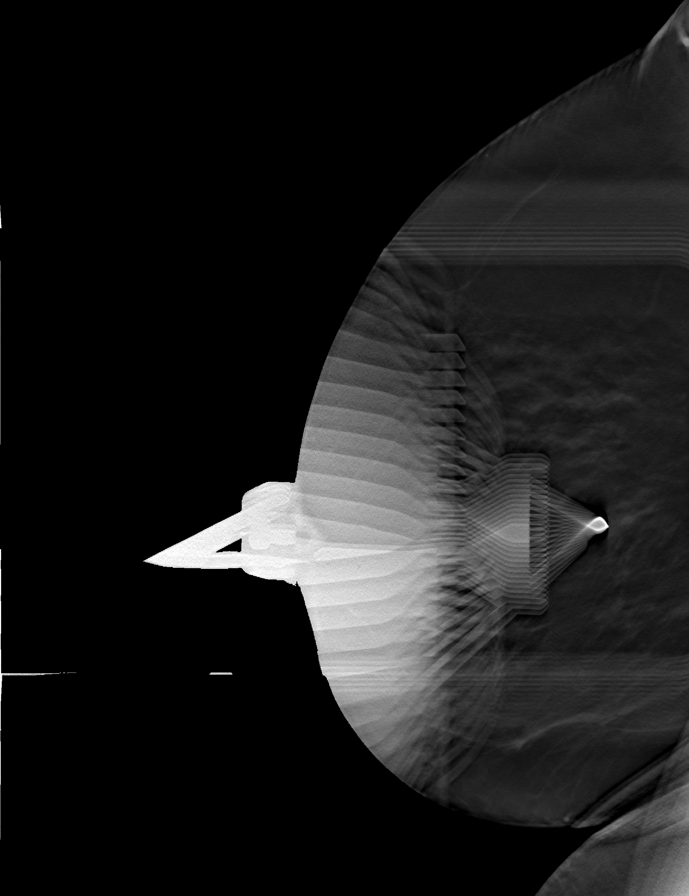

[8 of 23 positions shown; findings below may reference images not displayed]



Using sterile technique and 1% lidocaine and 1% lidocaine with
epinephrine as local anesthetic, under stereotactic guidance, a 9
gauge vacuum assisted device was used to perform core needle biopsy
of a mass in the upper-inner quadrant of the right breast using a
medial to lateral approach.

Lesion quadrant: Upper inner quadrant

At the conclusion of the procedure, a coil shaped tissue marker clip
was deployed into the biopsy cavity. Follow-up 2-view mammogram was
performed and dictated separately.
IMPRESSION: Stereotactic-guided biopsy of the right breast. No apparent
complications.

ADDENDUM:
Pathology revealed RIGHT breast upper inner quadrant- HEMANGIOMA.
This was found to be concordant by Dr. Mika Belt.

Pathology results were discussed with the patient by telephone. The
patient reported doing well after the biopsy with tenderness at the
site. Post biopsy instructions and care were reviewed and questions
were answered. The patient was encouraged to call The [REDACTED]

The patient was instructed to return for annual screening
mammography.

Imaging and pathology reports were faxed to Otusanya Saula, RN Nurse
Navigator at [facility] [REDACTED] on November 11, 2018.

Pathology results reported by Kimmey Mingoes, RN on 11/11/2018.

*** End of Addendum ***

## 2021-06-21 NOTE — H&P (Signed)
TOTAL HIP ADMISSION H&P  Patient is admitted for left total hip arthroplasty.  Subjective:  Chief Complaint: Left hip pain  HPI: Colleen Mcgrath, 80 y.o. female, has a history of pain and functional disability in the left hip due to arthritis and patient has failed non-surgical conservative treatments for greater than 12 weeks to include corticosteriod injections and activity modification. Onset of symptoms was gradual, starting  several  years ago with gradually worsening course since that time. The patient noted no past surgery on the left hip. Patient currently rates pain in the left hip at 8 out of 10 with activity. Patient has night pain, worsening of pain with activity and weight bearing, pain that interfers with activities of daily living, and pain with passive range of motion. Patient has evidence of  rapidly progressive changes with edema in the femoral head and acetabulum, joint effusion, and bone-on-bone change through the hip  by imaging studies. This condition presents safety issues increasing the risk of falls. There is no current active infection.  Patient Active Problem List   Diagnosis Date Noted   Longstanding persistent atrial fibrillation ([place]) 06/04/2018   Essential hypertension 06/04/2018   Diabetes mellitus type 2 in obese ([place]) 06/04/2018   Mixed hyperlipidemia 06/04/2018   OSA (obstructive sleep apnea) 06/04/2018    Past Medical History:  Diagnosis Date   Atrial fibrillation ([place]) 2015   Diabetes mellitus without complication ([place])    Hyperlipidemia    Hypertension    OSA (obstructive sleep apnea) 2017    Past Surgical History:  Procedure Laterality Date   [place]    Prior to Admission medications   Medication Sig Start Date End Date Taking? Authorizing Provider  Cholecalciferol (VITAMIN D-1000 MAX ST) 1000 units tablet Take by mouth.    [provider]  fenofibrate 160 MG  tablet TAKE 1 TABLET BY MOUTH EACH NIGHT AT BEDTIME 08/18/18   [provider]  glucose blood (CONTOUR TEST) test strip TEST ONCE DAILY 12/19/17   [provider]  Insulin Detemir (LEVEMIR FLEXTOUCH) 100 UNIT/ML Pen Inject into the skin. 10/31/17   [provider]  Insulin Pen Needle (BD PEN NEEDLE NANO U/F) 32G X 4 MM MISC Use 1 pen needle daily.E11.9 04/30/18   [provider]  lisinopril (PRINIVIL,ZESTRIL) 5 MG tablet Take by mouth. 04/25/18   [provider]  loratadine (CLARITIN) 10 MG tablet Take by mouth.    [provider]  metFORMIN (GLUCOPHAGE-XR) 500 MG 24 hr tablet Take 500 mg by mouth 2 (two) times daily.  08/07/18   [provider]  metoprolol tartrate (LOPRESSOR) 50 MG tablet Take by mouth. 04/16/18   [provider]  rivaroxaban (XARELTO) 20 MG TABS tablet Take 1 tablet (20 mg total) by mouth daily with supper. 01/12/20   Croitoru, Mihai, MD    Allergies  Allergen Reactions   Cephalexin    Colesevelam Hcl     gi   Ezetimibe    Statins     cramps   Sulfa Antibiotics    Cephalosporins Rash   Tetracyclines & Related Rash    Social History   Socioeconomic History   Marital status: Single    Spouse name: Not on file   Number of children: 2   Years of education: 15   Highest education level: Not on file  Occupational History   Not on file  Tobacco Use  Smoking status: Former    Types: Cigarettes    Quit date: 1999    Years since quitting: 23.6   Smokeless tobacco: Never  Substance and Sexual Activity   Alcohol use: Never   Drug use: Never   Sexual activity: Not on file  Other Topics Concern   Not on file  Social History Narrative   Not on file   Social Determinants of Health   Financial Resource Strain: Not on file  Food Insecurity: Not on file  Transportation Needs: Not on file  Physical Activity: Not on file  Stress: Not on file  Social Connections: Not on file  Intimate Partner  Violence: Not on file    Tobacco Use: Not on file   Social History   Substance and Sexual Activity  Alcohol Use Never    Family History  Problem Relation Age of Onset   Dementia Mother    Heart disease Father     Review of Systems  Constitutional:  Negative for chills and fever.  HENT:  Negative for congestion, sore throat and tinnitus.   Eyes:  Negative for double vision, photophobia and pain.  Respiratory:  Negative for cough, shortness of breath and wheezing.   Cardiovascular:  Negative for chest pain, palpitations and orthopnea.  Gastrointestinal:  Negative for heartburn, nausea and vomiting.  Genitourinary:  Negative for dysuria, frequency and urgency.  Musculoskeletal:  Positive for joint pain.  Neurological:  Negative for dizziness, weakness and headaches.    Objective:  Physical Exam: Well nourished and well developed.  General: Alert and oriented x3, cooperative and pleasant, no acute distress.  Head: normocephalic, atraumatic, neck supple.  Eyes: EOMI.  Respiratory: breath sounds clear in all fields, no wheezing, rales, or rhonchi. Cardiovascular: Regular rate and rhythm, no murmurs, gallops or rubs.  Abdomen: non-tender to palpation and soft, normoactive bowel sounds. Musculoskeletal:  Left Hip Exam:  The range of motion: Flexion to 100 degrees, Internal Rotation is minimal, External Rotation to 20 degrees, and abduction to 20 degrees without discomfort.  There is no tenderness over the greater trochanteric bursa.  Calves soft and nontender. Motor function intact in LE. Strength 5/5 LE bilaterally. Neuro: Distal pulses 2+. Sensation to light touch intact in LE.  Vital signs in last 24 hours: BP: ()/()  Arterial Line BP: ()/()   Imaging Review Plain radiographs demonstrate severe degenerative joint disease of the left hip. The bone quality appears to be adequate for age and reported activity level.  Assessment/Plan:  End stage arthritis, left  hip  The patient history, physical examination, clinical judgement of the provider and imaging studies are consistent with end stage degenerative joint disease of the left hip and total hip arthroplasty is deemed medically necessary. The treatment options including medical management, injection therapy, arthroscopy and arthroplasty were discussed at length. The risks and benefits of total hip arthroplasty were presented and reviewed. The risks due to aseptic loosening, infection, stiffness, dislocation/subluxation, thromboembolic complications and other imponderables were discussed. The patient acknowledged the explanation, agreed to proceed with the plan and consent was signed. Patient is being admitted for inpatient treatment for surgery, pain control, PT, OT, prophylactic antibiotics, VTE prophylaxis, progressive ambulation and ADLs and discharge planning.The patient is planning to be discharged  home .   Patient's anticipated LOS is less than 2 midnights, meeting these requirements: - Lives within 1 hour of care - Has a competent adult at home to recover with post-op recover - NO history of  - Chronic pain requiring  opiods  - Coronary Artery Disease  - Heart failure  - Heart attack  - Stroke  - DVT/VTE  - Respiratory Failure/COPD  - Renal failure  - Anemia  - Advanced Liver disease  Therapy Plans: HEP Disposition: Home with daughter Planned DVT Prophylaxis: Xarelto 20 mg QD DME Needed: [facility] Rile PCP: Beckie Salts, MD (requested cardiac clearance) Cardiologist: Abran Richard, MD (clearance in EPIC) TXA: IV Allergies: Cephalexin (rash), statins Anesthesia Concerns: None BMI: 30.9 Last HgbA1c: 7.3% [facility]  Other: - Hx atrial fibrillation, DM - Patient plans to bring Levemir with her, discussed using Novolog with sliding scale for short acting  - Patient was instructed on what medications to stop prior to surgery. - Follow-up visit in 2 weeks with Dr. Wynelle Link -  Begin physical therapy following surgery - Pre-operative lab work as pre-surgical testing - Prescriptions will be provided in hospital at time of discharge  Theresa Duty, PA-C Orthopedic Surgery [facility] [place]

## 2021-06-22 NOTE — Progress Notes (Signed)
Sent message, via epic in basket, requesting orders in epic from surgeon.  

## 2021-06-30 NOTE — Progress Notes (Signed)
DUE TO COVID-19 ONLY ONE VISITOR IS ALLOWED TO COME WITH YOU AND STAY IN THE WAITING ROOM ONLY DURING PRE OP AND PROCEDURE DAY OF SURGERY.  2 VISITOR  MAY VISIT WITH YOU AFTER SURGERY IN YOUR PRIVATE ROOM DURING VISITING HOURS ONLY!  YOU NEED TO HAVE A COVID 19 TEST ON___9/26/2022 '@_'$  '@_from'$  8am-3pm _____, THIS TEST MUST BE DONE BEFORE SURGERY,  Covid test is done at [place], [place] [address].  This is a drive thru.  No appt required. Please see map.                 Your procedure is scheduled on:  07/13/2021   Report to [facility] Main  Entrance   Report to admitting at     0620am      Call this number if you have problems the morning of surgery [phone]    REMEMBER: NO  SOLID FOOD CANDY OR GUM AFTER MIDNIGHT. CLEAR LIQUIDS UNTIL      0610am      . NOTHING BY MOUTH EXCEPT CLEAR LIQUIDS UNTIL  0610am   . PLEASE FINISH ENSURE DRINK PER SURGEON ORDER  WHICH NEEDS TO BE COMPLETED AT     0610am  .      CLEAR LIQUID DIET   Foods Allowed                                                                    Coffee and tea, regular and decaf                            Fruit ices (not with fruit pulp)                                      Iced Popsicles                                    Carbonated beverages, regular and diet                                    Cranberry, grape and apple juices Sports drinks like Gatorade Lightly seasoned clear broth or consume(fat free) Sugar, honey syrup ___________________________________________________________________      BRUSH YOUR TEETH MORNING OF SURGERY AND RINSE YOUR MOUTH OUT, NO CHEWING GUM CANDY OR MINTS.     Take these medicines the morning of surgery with A SIP OF WATER:  toprol   DO NOT TAKE ANY DIABETIC MEDICATIONS DAY OF YOUR SURGERY                               You may not have any metal on your body including hair pins and              piercings  Do not wear jewelry, make-up, lotions, powders or  perfumes, deodorant  Do not wear nail polish on your fingernails.  Do not shave  48 hours prior to surgery.              Men may shave face and neck.   Do not bring valuables to the hospital. [place].  Contacts, dentures or bridgework may not be worn into surgery.  Leave suitcase in the car. After surgery it may be brought to your room.     Patients discharged the day of surgery will not be allowed to drive home. IF YOU ARE HAVING SURGERY AND GOING HOME THE SAME DAY, YOU MUST HAVE AN ADULT TO DRIVE YOU HOME AND BE WITH YOU FOR 24 HOURS. YOU MAY GO HOME BY TAXI OR UBER OR ORTHERWISE, BUT AN ADULT MUST ACCOMPANY YOU HOME AND STAY WITH YOU FOR 24 HOURS.  Name and phone number of your driver:  Special Instructions: N/A              Please read over the following fact sheets you were given: _____________________________________________________________________  [facility] - Preparing for Surgery Before surgery, you can play an important role.  Because skin is not sterile, your skin needs to be as free of germs as possible.  You can reduce the number of germs on your skin by washing with CHG (chlorahexidine gluconate) soap before surgery.  CHG is an antiseptic cleaner which kills germs and bonds with the skin to continue killing germs even after washing. Please DO NOT use if you have an allergy to CHG or antibacterial soaps.  If your skin becomes reddened/irritated stop using the CHG and inform your nurse when you arrive at Short Stay. Do not shave (including legs and underarms) for at least 48 hours prior to the first CHG shower.  You may shave your face/neck. Please follow these instructions carefully:  1.  Shower with CHG Soap the night before surgery and the  morning of Surgery.  2.  If you choose to wash your hair, wash your hair first as usual with your  normal  shampoo.  3.  After you shampoo, rinse your hair and body thoroughly  to remove the  shampoo.                           4.  Use CHG as you would any other liquid soap.  You can apply chg directly  to the skin and wash                       Gently with a scrungie or clean washcloth.  5.  Apply the CHG Soap to your body ONLY FROM THE NECK DOWN.   Do not use on face/ open                           Wound or open sores. Avoid contact with eyes, ears mouth and genitals (private parts).                       Wash face,  Genitals (private parts) with your normal soap.             6.  Wash thoroughly, paying special attention to the area where your surgery  will be performed.  7.  Thoroughly rinse your body with  warm water from the neck down.  8.  DO NOT shower/wash with your normal soap after using and rinsing off  the CHG Soap.                9.  Pat yourself dry with a clean towel.            10.  Wear clean pajamas.            11.  Place clean sheets on your bed the night of your first shower and do not  sleep with pets. Day of Surgery : Do not apply any lotions/deodorants the morning of surgery.  Please wear clean clothes to the hospital/surgery center.  FAILURE TO FOLLOW THESE INSTRUCTIONS MAY RESULT IN THE CANCELLATION OF YOUR SURGERY PATIENT SIGNATURE_________________________________  NURSE SIGNATURE__________________________________  ________________________________________________________________________

## 2021-07-04 ENCOUNTER — Other Ambulatory Visit: Payer: Self-pay

## 2021-07-04 ENCOUNTER — Encounter (HOSPITAL_COMMUNITY)
Admission: RE | Admit: 2021-07-04 | Discharge: 2021-07-04 | Disposition: A | Discharge status: Home / Self Care | Payer: Medicare Other | Source: Ambulatory Visit | Attending: Orthopedic Surgery | Admitting: Orthopedic Surgery

## 2021-07-04 ENCOUNTER — Encounter (HOSPITAL_COMMUNITY): Payer: Self-pay

## 2021-07-04 DIAGNOSIS — M1612 Unilateral primary osteoarthritis, left hip: Secondary | ICD-10-CM | POA: Diagnosis not present

## 2021-07-04 DIAGNOSIS — I10 Essential (primary) hypertension: Secondary | ICD-10-CM | POA: Insufficient documentation

## 2021-07-04 DIAGNOSIS — Z79899 Other long term (current) drug therapy: Secondary | ICD-10-CM | POA: Diagnosis not present

## 2021-07-04 DIAGNOSIS — I4891 Unspecified atrial fibrillation: Secondary | ICD-10-CM | POA: Insufficient documentation

## 2021-07-04 DIAGNOSIS — Z87891 Personal history of nicotine dependence: Secondary | ICD-10-CM | POA: Diagnosis not present

## 2021-07-04 DIAGNOSIS — Z794 Long term (current) use of insulin: Secondary | ICD-10-CM | POA: Diagnosis not present

## 2021-07-04 DIAGNOSIS — Z01818 Encounter for other preprocedural examination: Secondary | ICD-10-CM | POA: Diagnosis not present

## 2021-07-04 DIAGNOSIS — E119 Type 2 diabetes mellitus without complications: Secondary | ICD-10-CM | POA: Insufficient documentation

## 2021-07-04 DIAGNOSIS — Z7901 Long term (current) use of anticoagulants: Secondary | ICD-10-CM | POA: Insufficient documentation

## 2021-07-04 DIAGNOSIS — G4733 Obstructive sleep apnea (adult) (pediatric): Secondary | ICD-10-CM | POA: Insufficient documentation

## 2021-07-04 HISTORY — DX: Other specified postprocedural states: R11.2

## 2021-07-04 HISTORY — DX: Malignant (primary) neoplasm, unspecified: C80.1

## 2021-07-04 HISTORY — DX: Unspecified osteoarthritis, unspecified site: M19.90

## 2021-07-04 HISTORY — DX: Other specified postprocedural states: Z98.890

## 2021-07-04 LAB — COMPREHENSIVE METABOLIC PANEL
ALT: 17 U/L (ref 0–44)
AST: 19 U/L (ref 15–41)
Albumin: 4.1 g/dL (ref 3.5–5.0)
Alkaline Phosphatase: 61 U/L (ref 38–126)
Anion gap: 7 (ref 5–15)
BUN: 25 mg/dL — ABNORMAL HIGH (ref 8–23)
CO2: 22 mmol/L (ref 22–32)
Calcium: 9.4 mg/dL (ref 8.9–10.3)
Chloride: 110 mmol/L (ref 98–111)
Creatinine, Ser: 0.99 mg/dL (ref 0.44–1.00)
GFR, Estimated: 58 mL/min — ABNORMAL LOW (ref >60)
Glucose, Bld: 173 mg/dL — ABNORMAL HIGH (ref 70–99)
Potassium: 4.7 mmol/L (ref 3.5–5.1)
Sodium: 139 mmol/L (ref 135–145)
Total Bilirubin: 0.6 mg/dL (ref 0.3–1.2)
Total Protein: 7 g/dL (ref 6.5–8.1)

## 2021-07-04 LAB — HEMOGLOBIN A1C
Hgb A1c MFr Bld: 6.9 % — ABNORMAL HIGH (ref 4.8–5.6)
Mean Plasma Glucose: 151.33 mg/dL

## 2021-07-04 LAB — CBC
HCT: 39.2 % (ref 36.0–46.0)
Hemoglobin: 12.7 g/dL (ref 12.0–15.0)
MCH: 31.2 pg (ref 26.0–34.0)
MCHC: 32.4 g/dL (ref 30.0–36.0)
MCV: 96.3 fL (ref 80.0–100.0)
Platelets: 374 10*3/uL (ref 150–400)
RBC: 4.07 MIL/uL (ref 3.87–5.11)
RDW: 13.5 % (ref 11.5–15.5)
WBC: 9.6 10*3/uL (ref 4.0–10.5)
nRBC: 0 % (ref 0.0–0.2)

## 2021-07-04 LAB — PROTIME-INR
INR: 1.8 — ABNORMAL HIGH (ref 0.8–1.2)
Prothrombin Time: 20.5 seconds — ABNORMAL HIGH (ref 11.4–15.2)

## 2021-07-04 LAB — SURGICAL PCR SCREEN
MRSA, PCR: NEGATIVE
Staphylococcus aureus: NEGATIVE

## 2021-07-04 LAB — GLUCOSE, CAPILLARY: Glucose-Capillary: 162 mg/dL — ABNORMAL HIGH (ref 70–99)

## 2021-07-04 NOTE — Progress Notes (Addendum)
      Anesthesia Review:  PCP: DR  Nelta Numbers 05/29/21- states needs cardiology clearance  Cardiologist : Danie Binder- davis- LOv 06/14/21  DR Abran Richard - clearance in visit for surgery  Requested most recent stress test and echo by fax from office of DR Prentice Docker.  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

## 2021-07-05 NOTE — Anesthesia Preprocedure Evaluation (Addendum)
Anesthesia Evaluation  Patient identified by MRN, date of birth, ID band Patient awake    Reviewed: Allergy & Precautions, NPO status , Patient's Chart, lab work & pertinent test results  History of Anesthesia Complications (+) PONV and history of anesthetic complications  Airway Mallampati: II  TM Distance: >3 FB Neck ROM: Full    Dental no notable dental hx. (+) Dental Advisory Given   Pulmonary sleep apnea , former smoker,    Pulmonary exam normal        Cardiovascular hypertension, Pt. on home beta blockers Normal cardiovascular exam  Echo 05/19/2020 SUMMARY  Mild left ventricular hypertrophy  Left ventricular systolic functionis normal.  LV ejection fraction = 55-60%.  There is aortic valve sclerosis.  There is mild aortic regurgitation.  There is mild mitral regurgitation.  There is no comparison study available.   Stress Test 04/20/2020 IMPRESSION:  1. No evidence of fixed or inducible ischemia in the setting  2. Calculated ejection fraction of 60%   3. Normal nuclear stress test without evidence of fixed or inducible  ischemia.    Neuro/Psych negative neurological ROS     GI/Hepatic negative GI ROS, Neg liver ROS,   Endo/Other  diabetes  Renal/GU negative Renal ROS     Musculoskeletal  (+) Arthritis ,   Abdominal   Peds  Hematology negative hematology ROS (+)   Anesthesia Other Findings   Reproductive/Obstetrics                           Anesthesia Physical Anesthesia Plan  ASA: 3  Anesthesia Plan: Spinal and MAC   Post-op Pain Management:    Induction:   PONV Risk Score and Plan: 4 or greater  Airway Management Planned: Natural Airway  Additional Equipment:   Intra-op Plan:   Post-operative Plan:   Informed Consent: I have reviewed the patients History and Physical, chart, labs and discussed the procedure including the risks, benefits and alternatives  for the proposed anesthesia with the patient or authorized representative who has indicated his/her understanding and acceptance.     Dental advisory given  Plan Discussed with: Anesthesiologist, CRNA and Surgeon  Anesthesia Plan Comments: (See PAT note 07/04/2021, Konrad Felix Ward, PA-C)      Anesthesia Quick Evaluation

## 2021-07-05 NOTE — Progress Notes (Addendum)
Anesthesia Chart Review   Case: [id number] Date/Time: 07/13/21 0855   Procedure: TOTAL HIP ARTHROPLASTY ANTERIOR APPROACH (Left: Hip)   Anesthesia type: Choice   Pre-op diagnosis: Left hip osteoarthritis   Location: [facility] ROOM 09 / [facility]   Surgeons: Gaynelle Arabian, MD       DISCUSSION:79 y.o. former smoker with h/o PONV, HTN, DM II (A1C 6.9), OSA, atrial fibrillation (on Xarelto), left hip OA scheduled for above procedure 07/13/2021 with Dr. Gaynelle Arabian.   Clearance from PCP received which states pt is moderate risk due to atrial fibrillation and needs a cardiac clearance.  Pt seen by cardiology 06/14/2021. Per OV note, "Left hip surgery scheduled for 07/13/21 with Dr. Chanetta Marshall. She had a nuclear scan which was negative last year. Echocardiogram performed last year demonstrated normal left ventricular systolic function. She does not need any further cardiac testing. She will proceed with proposed with surgery at low risk from a cardiac standpoint."  Anticipate pt can proceed with planned procedure barring acute status change.   VS: BP (!) 127/96   Pulse 75   Temp 36.7 C (Oral)   Resp 16   Ht 5' 6.5" (1.689 m)   Wt 89.4 kg   SpO2 98%   BMI 31.32 kg/m   PROVIDERS: Beckie Salts, MD is PCP   Abran Richard, MD is Cardiologist  LABS: Labs reviewed: Acceptable for surgery. (all labs ordered are listed, but only abnormal results are displayed)  Labs Reviewed  COMPREHENSIVE METABOLIC PANEL - Abnormal; Notable for the following components:      Result Value   Glucose, Bld 173 (*)    BUN 25 (*)    GFR, Estimated 58 (*)    All other components within normal limits  PROTIME-INR - Abnormal; Notable for the following components:   Prothrombin Time 20.5 (*)    INR 1.8 (*)    All other components within normal limits  HEMOGLOBIN A1C - Abnormal; Notable for the following components:   Hgb A1c MFr Bld 6.9 (*)    All other components within normal limits  GLUCOSE, CAPILLARY - Abnormal;  Notable for the following components:   Glucose-Capillary 162 (*)    All other components within normal limits  SURGICAL PCR SCREEN  CBC  TYPE AND SCREEN     IMAGES:   EKG:   CV: Echo 05/19/2020 SUMMARY  Mild left ventricular hypertrophy  Left ventricular systolic function is normal.  LV ejection fraction = 55-60%.  There is aortic valve sclerosis.  There is mild aortic regurgitation.  There is mild mitral regurgitation.  There is no comparison study available.   Stress Test 04/20/2020 IMPRESSION:  1.  No evidence of fixed or inducible ischemia in the setting  2.  Calculated ejection fraction of 60%   3.  Normal nuclear stress test without evidence of fixed or inducible  ischemia.  Past Medical History:  Diagnosis Date   Arthritis    Atrial fibrillation ([place]) 2015   Cancer ([place])    hx of skin cancer   Diabetes mellitus without complication ([place])    Hyperlipidemia    Hypertension    OSA (obstructive sleep apnea) 2017   no cpap   PONV (postoperative nausea and vomiting)    at age 48 when had appendex out    Past Surgical History:  Procedure Laterality Date   [place]:  acetaminophen (TYLENOL) 650 MG  CR tablet   Cholecalciferol 25 MCG (1000 UT) tablet   fenofibrate 160 MG tablet   glucose blood test strip   insulin detemir (LEVEMIR) 100 UNIT/ML FlexPen   Insulin Pen Needle 32G X 4 MM MISC   metFORMIN (GLUCOPHAGE-XR) 500 MG 24 hr tablet   metoprolol succinate (TOPROL-XL) 100 MG 24 hr tablet   rivaroxaban (XARELTO) 20 MG TABS tablet   No current facility-administered medications for this encounter.    Konrad Felix Ward, PA-C [facility] [phone]

## 2021-07-08 NOTE — Progress Notes (Signed)
PT called and Left message that  she had questions regarding meds am of surgery on 07/07/21.  Called pt back on 07/08/21 and pt is aware she is to take toprol am of surgery, 1/2 dose of Levemir nite before surgery and no diabetic meds am of surgery. PT is also aware to eat a healthy snack prior to bedtime the nite before surgery such as peanut butter crackers, cheese crackers or [place] sandwich.  PT voiced understanding of above instructoins.

## 2021-07-11 ENCOUNTER — Other Ambulatory Visit: Payer: Self-pay | Admitting: Orthopedic Surgery

## 2021-07-12 LAB — SARS CORONAVIRUS 2 (TAT 6-24 HRS): SARS Coronavirus 2: NEGATIVE

## 2021-07-13 ENCOUNTER — Other Ambulatory Visit: Payer: Self-pay

## 2021-07-13 ENCOUNTER — Ambulatory Visit (HOSPITAL_COMMUNITY): Payer: Medicare Other | Admitting: Physician Assistant

## 2021-07-13 ENCOUNTER — Encounter (HOSPITAL_COMMUNITY): Payer: Self-pay | Admitting: Orthopedic Surgery

## 2021-07-13 ENCOUNTER — Observation Stay (HOSPITAL_COMMUNITY): Payer: Medicare Other

## 2021-07-13 ENCOUNTER — Ambulatory Visit (HOSPITAL_COMMUNITY): Payer: Medicare Other

## 2021-07-13 ENCOUNTER — Ambulatory Visit (HOSPITAL_COMMUNITY): Payer: Medicare Other | Admitting: Certified Registered Nurse Anesthetist

## 2021-07-13 ENCOUNTER — Encounter (HOSPITAL_COMMUNITY)
Admission: RE | Disposition: A | Discharge status: Home / Self Care | Payer: Self-pay | Source: Home / Self Care | Attending: Orthopedic Surgery

## 2021-07-13 ENCOUNTER — Inpatient Hospital Stay (HOSPITAL_COMMUNITY)
Admission: RE | Admit: 2021-07-13 | Discharge: 2021-07-15 | DRG: 470 | Disposition: A | Discharge status: Home / Self Care | Payer: Medicare Other | Attending: Orthopedic Surgery | Admitting: Orthopedic Surgery

## 2021-07-13 DIAGNOSIS — Z96649 Presence of unspecified artificial hip joint: Secondary | ICD-10-CM

## 2021-07-13 DIAGNOSIS — Z79899 Other long term (current) drug therapy: Secondary | ICD-10-CM

## 2021-07-13 DIAGNOSIS — Z8249 Family history of ischemic heart disease and other diseases of the circulatory system: Secondary | ICD-10-CM

## 2021-07-13 DIAGNOSIS — S82122A Displaced fracture of lateral condyle of left tibia, initial encounter for closed fracture: Secondary | ICD-10-CM

## 2021-07-13 DIAGNOSIS — Z85828 Personal history of other malignant neoplasm of skin: Secondary | ICD-10-CM

## 2021-07-13 DIAGNOSIS — Z87891 Personal history of nicotine dependence: Secondary | ICD-10-CM

## 2021-07-13 DIAGNOSIS — Z7901 Long term (current) use of anticoagulants: Secondary | ICD-10-CM

## 2021-07-13 DIAGNOSIS — I10 Essential (primary) hypertension: Secondary | ICD-10-CM | POA: Diagnosis present

## 2021-07-13 DIAGNOSIS — Z7984 Long term (current) use of oral hypoglycemic drugs: Secondary | ICD-10-CM

## 2021-07-13 DIAGNOSIS — E119 Type 2 diabetes mellitus without complications: Secondary | ICD-10-CM | POA: Diagnosis present

## 2021-07-13 DIAGNOSIS — Z882 Allergy status to sulfonamides status: Secondary | ICD-10-CM

## 2021-07-13 DIAGNOSIS — I4811 Longstanding persistent atrial fibrillation: Secondary | ICD-10-CM | POA: Diagnosis present

## 2021-07-13 DIAGNOSIS — Z888 Allergy status to other drugs, medicaments and biological substances status: Secondary | ICD-10-CM

## 2021-07-13 DIAGNOSIS — Z881 Allergy status to other antibiotic agents status: Secondary | ICD-10-CM

## 2021-07-13 DIAGNOSIS — M169 Osteoarthritis of hip, unspecified: Secondary | ICD-10-CM | POA: Diagnosis present

## 2021-07-13 DIAGNOSIS — G4733 Obstructive sleep apnea (adult) (pediatric): Secondary | ICD-10-CM | POA: Diagnosis present

## 2021-07-13 DIAGNOSIS — M1612 Unilateral primary osteoarthritis, left hip: Principal | ICD-10-CM | POA: Diagnosis present

## 2021-07-13 DIAGNOSIS — Z794 Long term (current) use of insulin: Secondary | ICD-10-CM

## 2021-07-13 HISTORY — PX: TOTAL HIP ARTHROPLASTY: SHX124

## 2021-07-13 LAB — GLUCOSE, CAPILLARY
Glucose-Capillary: 186 mg/dL — ABNORMAL HIGH (ref 70–99)
Glucose-Capillary: 187 mg/dL — ABNORMAL HIGH (ref 70–99)
Glucose-Capillary: 222 mg/dL — ABNORMAL HIGH (ref 70–99)
Glucose-Capillary: 260 mg/dL — ABNORMAL HIGH (ref 70–99)

## 2021-07-13 LAB — TYPE AND SCREEN
ABO/RH(D): O POS
Antibody Screen: NEGATIVE

## 2021-07-13 LAB — ABO/RH: ABO/RH(D): O POS

## 2021-07-13 SURGERY — ARTHROPLASTY, HIP, TOTAL, ANTERIOR APPROACH
Anesthesia: Monitor Anesthesia Care | Site: Hip | Laterality: Left

## 2021-07-13 MED ORDER — PROMETHAZINE HCL 25 MG/ML IJ SOLN: 6.2500 mg | INTRAMUSCULAR | Status: DC | PRN

## 2021-07-13 MED ORDER — DEXAMETHASONE SODIUM PHOSPHATE 10 MG/ML IJ SOLN
INTRAMUSCULAR | Status: DC | PRN
  Administered 2021-07-13: 5 mg via INTRAVENOUS

## 2021-07-13 MED ORDER — LACTATED RINGERS IV SOLN: INTRAVENOUS | Status: DC

## 2021-07-13 MED ORDER — VANCOMYCIN HCL IN DEXTROSE 1-5 GM/200ML-% IV SOLN
1000.0000 mg | Freq: Two times a day (BID) | INTRAVENOUS | Status: AC
  Administered 2021-07-13: 1000 mg via INTRAVENOUS
  Filled 2021-07-13: qty 200

## 2021-07-13 MED ORDER — METOPROLOL SUCCINATE ER 50 MG PO TB24
100.0000 mg | ORAL_TABLET | Freq: Every day | ORAL | Status: DC
  Administered 2021-07-14 – 2021-07-15 (×2): 100 mg via ORAL
  Filled 2021-07-13 (×2): qty 2

## 2021-07-13 MED ORDER — METOCLOPRAMIDE HCL 5 MG PO TABS: 5.0000 mg | ORAL_TABLET | Freq: Three times a day (TID) | ORAL | Status: DC | PRN

## 2021-07-13 MED ORDER — METHOCARBAMOL 500 MG IVPB - SIMPLE MED
500.0000 mg | Freq: Four times a day (QID) | INTRAVENOUS | Status: DC | PRN
  Filled 2021-07-13: qty 50

## 2021-07-13 MED ORDER — PROPOFOL 1000 MG/100ML IV EMUL
INTRAVENOUS | Status: AC
  Filled 2021-07-13: qty 100

## 2021-07-13 MED ORDER — ONDANSETRON HCL 4 MG/2ML IJ SOLN: 4.0000 mg | Freq: Four times a day (QID) | INTRAMUSCULAR | Status: DC | PRN

## 2021-07-13 MED ORDER — ONDANSETRON HCL 4 MG/2ML IJ SOLN
INTRAMUSCULAR | Status: AC
  Filled 2021-07-13: qty 2

## 2021-07-13 MED ORDER — ONDANSETRON HCL 4 MG PO TABS: 4.0000 mg | ORAL_TABLET | Freq: Four times a day (QID) | ORAL | Status: DC | PRN

## 2021-07-13 MED ORDER — AMISULPRIDE (ANTIEMETIC) 5 MG/2ML IV SOLN: 10.0000 mg | Freq: Once | INTRAVENOUS | Status: DC | PRN

## 2021-07-13 MED ORDER — FENTANYL CITRATE PF 50 MCG/ML IJ SOSY: 25.0000 ug | PREFILLED_SYRINGE | INTRAMUSCULAR | Status: DC | PRN

## 2021-07-13 MED ORDER — BUPIVACAINE IN DEXTROSE 0.75-8.25 % IT SOLN
INTRATHECAL | Status: DC | PRN
  Administered 2021-07-13: 1.8 mL via INTRATHECAL

## 2021-07-13 MED ORDER — PHENYLEPHRINE HCL (PRESSORS) 10 MG/ML IV SOLN
INTRAVENOUS | Status: AC
  Filled 2021-07-13: qty 2

## 2021-07-13 MED ORDER — BUPIVACAINE-EPINEPHRINE (PF) 0.25% -1:200000 IJ SOLN
INTRAMUSCULAR | Status: DC | PRN
  Administered 2021-07-13: 30 mL

## 2021-07-13 MED ORDER — ORAL CARE MOUTH RINSE: 15.0000 mL | Freq: Once | OROMUCOSAL | Status: AC

## 2021-07-13 MED ORDER — RIVAROXABAN 10 MG PO TABS
10.0000 mg | ORAL_TABLET | Freq: Every day | ORAL | Status: DC
  Administered 2021-07-14 – 2021-07-15 (×2): 10 mg via ORAL
  Filled 2021-07-13 (×2): qty 1

## 2021-07-13 MED ORDER — ACETAMINOPHEN 10 MG/ML IV SOLN
1000.0000 mg | Freq: Four times a day (QID) | INTRAVENOUS | Status: DC
  Administered 2021-07-13: 1000 mg via INTRAVENOUS
  Filled 2021-07-13: qty 100

## 2021-07-13 MED ORDER — PHENYLEPHRINE 40 MCG/ML (10ML) SYRINGE FOR IV PUSH (FOR BLOOD PRESSURE SUPPORT)
PREFILLED_SYRINGE | INTRAVENOUS | Status: DC | PRN
  Administered 2021-07-13: 40 ug via INTRAVENOUS
  Administered 2021-07-13: 120 ug via INTRAVENOUS
  Administered 2021-07-13: 80 ug via INTRAVENOUS

## 2021-07-13 MED ORDER — VANCOMYCIN HCL IN DEXTROSE 1-5 GM/200ML-% IV SOLN
1000.0000 mg | INTRAVENOUS | Status: AC
  Administered 2021-07-13: 1000 mg via INTRAVENOUS
  Filled 2021-07-13: qty 200

## 2021-07-13 MED ORDER — 0.9 % SODIUM CHLORIDE (POUR BTL) OPTIME
TOPICAL | Status: DC | PRN
  Administered 2021-07-13: 1000 mL

## 2021-07-13 MED ORDER — ACETAMINOPHEN 325 MG PO TABS
325.0000 mg | ORAL_TABLET | Freq: Four times a day (QID) | ORAL | Status: DC | PRN
  Administered 2021-07-14 – 2021-07-15 (×3): 650 mg via ORAL
  Filled 2021-07-13 (×3): qty 2

## 2021-07-13 MED ORDER — PROPOFOL 500 MG/50ML IV EMUL
INTRAVENOUS | Status: DC | PRN
  Administered 2021-07-13: 50 ug/kg/min via INTRAVENOUS

## 2021-07-13 MED ORDER — FENOFIBRATE 160 MG PO TABS
160.0000 mg | ORAL_TABLET | Freq: Every day | ORAL | Status: DC
  Administered 2021-07-14 – 2021-07-15 (×2): 160 mg via ORAL
  Filled 2021-07-13 (×2): qty 1

## 2021-07-13 MED ORDER — CHLORHEXIDINE GLUCONATE 0.12 % MT SOLN
15.0000 mL | Freq: Once | OROMUCOSAL | Status: AC
  Administered 2021-07-13: 15 mL via OROMUCOSAL

## 2021-07-13 MED ORDER — HYDROCODONE-ACETAMINOPHEN 5-325 MG PO TABS
1.0000 | ORAL_TABLET | ORAL | Status: DC | PRN
  Administered 2021-07-13: 2 via ORAL
  Administered 2021-07-13 – 2021-07-14 (×3): 1 via ORAL
  Administered 2021-07-14: 2 via ORAL
  Filled 2021-07-13: qty 1
  Filled 2021-07-13: qty 2
  Filled 2021-07-13: qty 1
  Filled 2021-07-13: qty 2
  Filled 2021-07-13: qty 1

## 2021-07-13 MED ORDER — METHOCARBAMOL 500 MG PO TABS
500.0000 mg | ORAL_TABLET | Freq: Four times a day (QID) | ORAL | Status: DC | PRN
  Administered 2021-07-13 – 2021-07-14 (×2): 500 mg via ORAL
  Filled 2021-07-13 (×2): qty 1

## 2021-07-13 MED ORDER — ONDANSETRON HCL 4 MG/2ML IJ SOLN
INTRAMUSCULAR | Status: DC | PRN
  Administered 2021-07-13: 4 mg via INTRAVENOUS

## 2021-07-13 MED ORDER — LIDOCAINE HCL (PF) 2 % IJ SOLN
INTRAMUSCULAR | Status: AC
  Filled 2021-07-13: qty 5

## 2021-07-13 MED ORDER — MORPHINE SULFATE (PF) 2 MG/ML IV SOLN: 0.5000 mg | INTRAVENOUS | Status: DC | PRN

## 2021-07-13 MED ORDER — TRANEXAMIC ACID-NACL 1000-0.7 MG/100ML-% IV SOLN
1000.0000 mg | INTRAVENOUS | Status: AC
  Administered 2021-07-13: 1000 mg via INTRAVENOUS
  Filled 2021-07-13: qty 100

## 2021-07-13 MED ORDER — INSULIN DETEMIR 100 UNIT/ML ~~LOC~~ SOLN
45.0000 [IU] | Freq: Every day | SUBCUTANEOUS | Status: DC
  Administered 2021-07-13 – 2021-07-14 (×2): 45 [IU] via SUBCUTANEOUS
  Filled 2021-07-13 (×3): qty 0.45

## 2021-07-13 MED ORDER — FENTANYL CITRATE (PF) 100 MCG/2ML IJ SOLN
INTRAMUSCULAR | Status: DC | PRN
  Administered 2021-07-13 (×2): 50 ug via INTRAVENOUS

## 2021-07-13 MED ORDER — LIDOCAINE HCL (CARDIAC) PF 100 MG/5ML IV SOSY
PREFILLED_SYRINGE | INTRAVENOUS | Status: DC | PRN
Start: 2021-07-13 — End: 2021-07-13
  Administered 2021-07-13: 60 mg via INTRAVENOUS

## 2021-07-13 MED ORDER — DEXAMETHASONE SODIUM PHOSPHATE 10 MG/ML IJ SOLN: 8.0000 mg | Freq: Once | INTRAMUSCULAR | Status: DC

## 2021-07-13 MED ORDER — PROPOFOL 10 MG/ML IV BOLUS
INTRAVENOUS | Status: AC
  Filled 2021-07-13: qty 20

## 2021-07-13 MED ORDER — WATER FOR IRRIGATION, STERILE IR SOLN
Status: DC | PRN
  Administered 2021-07-13: 2000 mL

## 2021-07-13 MED ORDER — POVIDONE-IODINE 10 % EX SWAB
2.0000 "application " | Freq: Once | CUTANEOUS | Status: AC
  Administered 2021-07-13: 2 via TOPICAL

## 2021-07-13 MED ORDER — MENTHOL 3 MG MT LOZG: 1.0000 | LOZENGE | OROMUCOSAL | Status: DC | PRN

## 2021-07-13 MED ORDER — DOCUSATE SODIUM 100 MG PO CAPS
100.0000 mg | ORAL_CAPSULE | Freq: Two times a day (BID) | ORAL | Status: DC
  Administered 2021-07-13 – 2021-07-15 (×4): 100 mg via ORAL
  Filled 2021-07-13 (×4): qty 1

## 2021-07-13 MED ORDER — BISACODYL 10 MG RE SUPP: 10.0000 mg | Freq: Every day | RECTAL | Status: DC | PRN

## 2021-07-13 MED ORDER — TRAMADOL HCL 50 MG PO TABS
50.0000 mg | ORAL_TABLET | Freq: Four times a day (QID) | ORAL | Status: DC | PRN
  Administered 2021-07-13: 50 mg via ORAL
  Filled 2021-07-13 (×2): qty 1

## 2021-07-13 MED ORDER — BUPIVACAINE-EPINEPHRINE (PF) 0.25% -1:200000 IJ SOLN
INTRAMUSCULAR | Status: AC
  Filled 2021-07-13: qty 30

## 2021-07-13 MED ORDER — POLYETHYLENE GLYCOL 3350 17 G PO PACK: 17.0000 g | PACK | Freq: Every day | ORAL | Status: DC | PRN

## 2021-07-13 MED ORDER — PROPOFOL 10 MG/ML IV BOLUS
INTRAVENOUS | Status: DC | PRN
  Administered 2021-07-13 (×7): 10 mg via INTRAVENOUS

## 2021-07-13 MED ORDER — INSULIN ASPART 100 UNIT/ML IJ SOLN
0.0000 [IU] | Freq: Three times a day (TID) | INTRAMUSCULAR | Status: DC
  Administered 2021-07-13: 5 [IU] via SUBCUTANEOUS
  Administered 2021-07-14: 3 [IU] via SUBCUTANEOUS
  Administered 2021-07-14: 8 [IU] via SUBCUTANEOUS
  Administered 2021-07-14: 5 [IU] via SUBCUTANEOUS
  Administered 2021-07-15: 2 [IU] via SUBCUTANEOUS
  Administered 2021-07-15: 3 [IU] via SUBCUTANEOUS

## 2021-07-13 MED ORDER — PHENYLEPHRINE HCL-NACL 20-0.9 MG/250ML-% IV SOLN
INTRAVENOUS | Status: DC | PRN
  Administered 2021-07-13: 25 ug/min via INTRAVENOUS

## 2021-07-13 MED ORDER — SODIUM CHLORIDE 0.9 % IV SOLN
INTRAVENOUS | Status: DC
  Administered 2021-07-13: 1000 mL via INTRAVENOUS

## 2021-07-13 MED ORDER — PHENOL 1.4 % MT LIQD: 1.0000 | OROMUCOSAL | Status: DC | PRN

## 2021-07-13 MED ORDER — PHENYLEPHRINE 40 MCG/ML (10ML) SYRINGE FOR IV PUSH (FOR BLOOD PRESSURE SUPPORT)
PREFILLED_SYRINGE | INTRAVENOUS | Status: AC
  Filled 2021-07-13: qty 10

## 2021-07-13 MED ORDER — METOCLOPRAMIDE HCL 5 MG/ML IJ SOLN: 5.0000 mg | Freq: Three times a day (TID) | INTRAMUSCULAR | Status: DC | PRN

## 2021-07-13 MED ORDER — FENTANYL CITRATE (PF) 100 MCG/2ML IJ SOLN
INTRAMUSCULAR | Status: AC
  Filled 2021-07-13: qty 2

## 2021-07-13 SURGICAL SUPPLY — 46 items
BAG COUNTER SPONGE SURGICOUNT (BAG) IMPLANT
BAG DECANTER FOR FLEXI CONT (MISCELLANEOUS) IMPLANT
BAG SPEC THK2 15X12 ZIP CLS (MISCELLANEOUS)
BAG SPNG CNTER NS LX DISP (BAG)
BAG ZIPLOCK 12X15 (MISCELLANEOUS) IMPLANT
BLADE SAG 18X100X1.27 (BLADE) ×2 IMPLANT
CLSR STERI-STRIP ANTIMIC 1/2X4 (GAUZE/BANDAGES/DRESSINGS) ×2 IMPLANT
COVER PERINEAL POST (MISCELLANEOUS) ×2 IMPLANT
COVER SURGICAL LIGHT HANDLE (MISCELLANEOUS) ×2 IMPLANT
CUP ACET PINNACLE SECTR 50MM (Hips) ×1 IMPLANT
DECANTER SPIKE VIAL GLASS SM (MISCELLANEOUS) ×2 IMPLANT
DRAPE FOOT SWITCH (DRAPES) ×2 IMPLANT
DRAPE STERI IOBAN 125X83 (DRAPES) ×2 IMPLANT
DRAPE U-SHAPE 47X51 STRL (DRAPES) ×4 IMPLANT
DRSG AQUACEL AG ADV 3.5X10 (GAUZE/BANDAGES/DRESSINGS) ×2 IMPLANT
DURAPREP 26ML APPLICATOR (WOUND CARE) ×2 IMPLANT
ELECT REM PT RETURN 15FT ADLT (MISCELLANEOUS) ×2 IMPLANT
FEM STEM 12/14 TAPER SZ 4 HIP (Orthopedic Implant) ×2 IMPLANT
FEMORAL STEM 12/14 TPR SZ4 HIP (Orthopedic Implant) ×1 IMPLANT
GLOVE SRG 8 PF TXTR STRL LF DI (GLOVE) ×1 IMPLANT
GLOVE SURG ENC MOIS LTX SZ6.5 (GLOVE) ×2 IMPLANT
GLOVE SURG ENC MOIS LTX SZ7 (GLOVE) ×2 IMPLANT
GLOVE SURG ENC MOIS LTX SZ8 (GLOVE) ×4 IMPLANT
GLOVE SURG UNDER POLY LF SZ7 (GLOVE) ×2 IMPLANT
GLOVE SURG UNDER POLY LF SZ8 (GLOVE) ×2
GLOVE SURG UNDER POLY LF SZ8.5 (GLOVE) IMPLANT
GOWN STRL REUS W/TWL LRG LVL3 (GOWN DISPOSABLE) ×4 IMPLANT
GOWN STRL REUS W/TWL XL LVL3 (GOWN DISPOSABLE) IMPLANT
HEAD FEM STD 32X+1 STRL (Hips) ×2 IMPLANT
HOLDER FOLEY CATH W/STRAP (MISCELLANEOUS) ×2 IMPLANT
KIT TURNOVER KIT A (KITS) ×2 IMPLANT
LINER MARATHON 32 50 (Hips) ×2 IMPLANT
MANIFOLD NEPTUNE II (INSTRUMENTS) ×2 IMPLANT
PACK ANTERIOR HIP CUSTOM (KITS) ×2 IMPLANT
PENCIL SMOKE EVACUATOR COATED (MISCELLANEOUS) ×2 IMPLANT
PINNACLE SECTOR CUP 50MM (Hips) ×2 IMPLANT
STRIP CLOSURE SKIN 1/2X4 (GAUZE/BANDAGES/DRESSINGS) ×2 IMPLANT
SUT ETHIBOND NAB CT1 #1 30IN (SUTURE) ×2 IMPLANT
SUT MNCRL AB 4-0 PS2 18 (SUTURE) ×2 IMPLANT
SUT STRATAFIX 0 PDS 27 VIOLET (SUTURE) ×2
SUT VIC AB 2-0 CT1 27 (SUTURE) ×4
SUT VIC AB 2-0 CT1 TAPERPNT 27 (SUTURE) ×2 IMPLANT
SUTURE STRATFX 0 PDS 27 VIOLET (SUTURE) ×1 IMPLANT
SYR 50ML LL SCALE MARK (SYRINGE) IMPLANT
TRAY FOLEY MTR SLVR 16FR STAT (SET/KITS/TRAYS/PACK) ×2 IMPLANT
TUBE SUCTION HIGH CAP CLEAR NV (SUCTIONS) ×2 IMPLANT

## 2021-07-13 NOTE — Evaluation (Signed)
Physical Therapy Evaluation Patient Details Name: Colleen Mcgrath MRN: [id number] DOB: 11-05-1940 Today's Date: 07/13/2021  History of Present Illness  80 y.o. female admitted 07/13/21 for L AA-THA. PMH includes afib, HTN, DM, OSA.  Clinical Impression  Pt is s/p THA resulting in the deficits listed below (see PT Problem List). Min A for supine to sit. Attempted sit to stand x 2 trials, but pt was unable to come to full upright position as BLEs were buckling. Pt reported her legs still feel a bit numb from the spinal. She sat at edge of bed ~18 minutes. Instructed pt in LLE ROM exercises. Good progress expected. Pt will benefit from skilled PT to increase their independence and safety with mobility to allow discharge to the venue listed below.         Recommendations for follow up therapy are one component of a multi-disciplinary discharge planning process, led by the attending physician.  Recommendations may be updated based on patient status, additional functional criteria and insurance authorization.  Follow Up Recommendations Follow surgeon's recommendation for DC plan and follow-up therapies    Equipment Recommendations  Rolling [place] with 5" wheels    Recommendations for Other Services       Precautions / Restrictions Precautions Precautions: Fall Restrictions Weight Bearing Restrictions: No Other Position/Activity Restrictions: WBAT LLE      Mobility  Bed Mobility Overal bed mobility: Needs Assistance Bed Mobility: Supine to Sit     Supine to sit: Min assist     General bed mobility comments: assist for LLE OOB    Transfers Overall transfer level: Needs assistance Equipment used: Rolling [place] (2 wheeled) Transfers: Sit to/from Stand Sit to Stand: From elevated surface;Mod assist         General transfer comment: pt came to partial stand x 2 trials, unable to come to full stand, she stated she felt her legs were still numb and had poor motor control of BLEs,  BLEs began to buckle during sit to stand trials  Ambulation/Gait                Stairs            Wheelchair Mobility    Modified Rankin (Stroke Patients Only)       Balance Overall balance assessment: Needs assistance Sitting-balance support: Feet unsupported;No upper extremity supported Sitting balance-Leahy Scale: Good       Standing balance-Leahy Scale: Zero                               Pertinent Vitals/Pain Pain Assessment: 0-10 Pain Score: 3  Pain Location: L hip Pain Descriptors / Indicators: Operative site guarding;Sore Pain Intervention(s): Limited activity within patient's tolerance;Monitored during session;Patient requesting pain meds-RN notified;Ice applied    Home Living Family/patient expects to be discharged to:: Private residence Living Arrangements: Alone Available Help at Discharge: Family Type of Home: House Home Access: Level entry     Home Layout: One level Home Equipment: Cane - single point      Prior Function Level of Independence: Independent with assistive device(s)         Comments: walked with cane, no falls in past 1 year     Hand Dominance        Extremity/Trunk Assessment   Upper Extremity Assessment Upper Extremity Assessment: Overall WFL for tasks assessed    Lower Extremity Assessment Lower Extremity Assessment: LLE deficits/detail LLE Deficits / Details: hip 2/5, knee  ext at least 3/5 LLE Sensation: WNL LLE Coordination: WNL    Cervical / Trunk Assessment Cervical / Trunk Assessment: Normal  Communication   Communication: No difficulties  Cognition Arousal/Alertness: Awake/alert Behavior During Therapy: WFL for tasks assessed/performed Overall Cognitive Status: Within Functional Limits for tasks assessed                                        General Comments      Exercises Total Joint Exercises Ankle Circles/Pumps: AROM;Both;10 reps;Supine Heel Slides:  AAROM;Left;10 reps;Supine Hip ABduction/ADduction: AAROM;Left;10 reps;Supine Long Arc Quad: AROM;Left;10 reps;Seated   Assessment/Plan    PT Assessment Patient needs continued PT services  PT Problem List Decreased strength;Decreased activity tolerance;Decreased mobility;Pain;Decreased knowledge of use of DME       PT Treatment Interventions DME instruction;Gait training;Therapeutic exercise;Patient/family education;Therapeutic activities    PT Goals (Current goals can be found in the Care Plan section)  Acute Rehab PT Goals Patient Stated Goal: walking & hiking PT Goal Formulation: With patient/family Time For Goal Achievement: 07/20/21 Potential to Achieve Goals: Good    Frequency 7X/week   Barriers to discharge        Co-evaluation               AM-PAC PT "6 Clicks" Mobility  Outcome Measure Help needed turning from your back to your side while in a flat bed without using bedrails?: A Little Help needed moving from lying on your back to sitting on the side of a flat bed without using bedrails?: A Lot Help needed moving to and from a bed to a chair (including a wheelchair)?: Total Help needed standing up from a chair using your arms (e.g., wheelchair or bedside chair)?: Total Help needed to walk in hospital room?: Total Help needed climbing 3-5 steps with a railing? : Total 6 Click Score: 9    End of Session Equipment Utilized During Treatment: Gait belt Activity Tolerance: Patient tolerated treatment well Patient left: in bed;with call bell/phone within reach;with family/visitor present;with bed alarm set Nurse Communication: Mobility status PT Visit Diagnosis: Difficulty in walking, not elsewhere classified (R26.2);Pain Pain - Right/Left: Left Pain - part of body: Hip    Time: 8638-1771 PT Time Calculation (min) (ACUTE ONLY): 48 min   Charges:   PT Evaluation $PT Eval Moderate Complexity: 1 Mod PT Treatments $Therapeutic Exercise: 8-22  mins $Therapeutic Activity: 8-22 mins       Blondell Reveal Kistler PT 07/13/2021  Acute Rehabilitation Services Pager [phone] Office [phone]

## 2021-07-13 NOTE — Plan of Care (Signed)
  Problem: Education: Goal: Knowledge of General Education information will improve Description: Including pain rating scale, medication(s)/side effects and non-pharmacologic comfort measures Outcome: Progressing   Problem: Health Behavior/Discharge Planning: Goal: Ability to manage health-related needs will improve Outcome: Progressing   Problem: Nutrition: Goal: Adequate nutrition will be maintained Outcome: Progressing   Problem: Pain Managment: Goal: General experience of comfort will improve Outcome: Progressing   Problem: Safety: Goal: Ability to remain free from injury will improve Outcome: Progressing   Problem: Skin Integrity: Goal: Risk for impaired skin integrity will decrease Outcome: Progressing   Problem: Education: Goal: Knowledge of the prescribed therapeutic regimen will improve Outcome: Progressing   Problem: Clinical Measurements: Goal: Postoperative complications will be avoided or minimized Outcome: Progressing

## 2021-07-13 NOTE — Transfer of Care (Signed)
Immediate Anesthesia Transfer of Care Note  Patient: Colleen Mcgrath  Procedure(s) Performed: TOTAL HIP ARTHROPLASTY ANTERIOR APPROACH (Left: Hip)  Patient Location: PACU  Anesthesia Type:Spinal  Level of Consciousness: drowsy and patient cooperative  Airway & Oxygen Therapy: Patient Spontanous Breathing and Patient connected to face mask oxygen  Post-op Assessment: Report given to RN and Post -op Vital signs reviewed and stable  Post vital signs: Reviewed and stable  Last Vitals:  Vitals Value Taken Time  BP 91/60 07/13/21 1032  Temp    Pulse 80 07/13/21 1035  Resp 16 07/13/21 1035  SpO2 98 % 07/13/21 1035  Vitals shown include unvalidated device data.  Last Pain:  Vitals:   07/13/21 0730  TempSrc:   PainSc: 0-No pain      Patients Stated Pain Goal: 3 (90/94/00 0505)  Complications: No notable events documented.

## 2021-07-13 NOTE — Interval H&P Note (Signed)
History and Physical Interval Note:  07/13/2021 6:47 AM  Colleen Mcgrath  has presented today for surgery, with the diagnosis of Left hip osteoarthritis.  The various methods of treatment have been discussed with the patient and family. After consideration of risks, benefits and other options for treatment, the patient has consented to  Procedure(s): TOTAL HIP ARTHROPLASTY ANTERIOR APPROACH (Left) as a surgical intervention.  The patient's history has been reviewed, patient examined, no change in status, stable for surgery.  I have reviewed the patient's chart and labs.  Questions were answered to the patient's satisfaction.     Pilar Plate Colleen Mcgrath

## 2021-07-13 NOTE — Anesthesia Postprocedure Evaluation (Signed)
Anesthesia Post Note  Patient: Colleen Mcgrath  Procedure(s) Performed: TOTAL HIP ARTHROPLASTY ANTERIOR APPROACH (Left: Hip)     Patient location during evaluation: PACU Anesthesia Type: MAC Level of consciousness: awake and alert Pain management: pain level controlled Vital Signs Assessment: post-procedure vital signs reviewed and stable Respiratory status: spontaneous breathing and respiratory function stable Cardiovascular status: blood pressure returned to baseline and stable Postop Assessment: spinal receding Anesthetic complications: no   No notable events documented.  Last Vitals:  Vitals:   07/13/21 1145 07/13/21 1200  BP: 114/71 117/88  Pulse: 84 73  Resp: 18 15  Temp:  36.4 C  SpO2: 97% 99%    Last Pain:  Vitals:   07/13/21 1200  TempSrc:   PainSc: 0-No pain                 Trindon Dorton DANIEL

## 2021-07-13 NOTE — Anesthesia Procedure Notes (Signed)
Spinal  Patient location during procedure: OR Start time: 07/13/2021 8:47 AM End time: 07/13/2021 8:50 AM Reason for block: surgical anesthesia Staffing Performed: resident/CRNA  Anesthesiologist: Duane Boston, MD Resident/CRNA: Raenette Rover, CRNA Preanesthetic Checklist Completed: patient identified, IV checked, site marked, risks and benefits discussed, surgical consent, monitors and equipment checked, pre-op evaluation and timeout performed Spinal Block Patient position: sitting Prep: DuraPrep Patient monitoring: blood pressure, continuous pulse ox and heart rate Approach: midline Location: L3-4 Injection technique: single-shot Needle Needle type: Pencan  Needle gauge: 24 G Assessment Sensory level: T6 Events: CSF return

## 2021-07-13 NOTE — Discharge Instructions (Signed)
°Frank Aluisio, MD °Total Joint Specialist °[facility] °[address] °[place], [place] [address] °[phone] ° °ANTERIOR APPROACH TOTAL HIP REPLACEMENT POSTOPERATIVE DIRECTIONS ° ° ° ° °Hip Rehabilitation, Guidelines Following Surgery  °The results of a hip operation are greatly improved after range of motion and muscle strengthening exercises. Follow all safety measures which are given to protect your hip. If any of these exercises cause increased pain or swelling in your joint, decrease the amount until you are comfortable again. Then slowly increase the exercises. Call your caregiver if you have problems or questions.  ° °HOME CARE INSTRUCTIONS  °Remove items at home which could result in a fall. This includes throw rugs or furniture in walking pathways.  °ICE to the affected hip as frequently as 20-30 minutes an hour and then as needed for pain and swelling. Continue to use ice on the hip for pain and swelling from surgery. You may notice swelling that will progress down to the foot and ankle. This is normal after surgery. Elevate the leg when you are not up walking on it.   °Continue to use the breathing machine which will help keep your temperature down.  It is common for your temperature to cycle up and down following surgery, especially at night when you are not up moving around and exerting yourself.  The breathing machine keeps your lungs expanded and your temperature down. ° °DIET °You may resume your previous home diet once your are discharged from the hospital. ° °DRESSING / WOUND CARE / SHOWERING °You have an adhesive waterproof bandage over the incision. Leave this in place until your first follow-up appointment. Once you remove this you will not need to place another bandage.  °You may begin showering 3 days following surgery, but do not submerge the incision under water. ° °ACTIVITY °For the first 3-5 days, it is important to rest and keep the operative leg elevated.  You should, as a general rule, rest for 50 minutes and walk/stretch for 10 minutes per hour. After 5 days, you may slowly increase activity as tolerated.  °Perform the exercises you were provided twice a day for about 15-20 minutes each session. Begin these 2 days following surgery. °Walk with your [place] as instructed. Use the [place] until you are comfortable transitioning to a cane. Walk with the cane in the opposite hand of the operative leg. You may discontinue the cane once you are comfortable and walking steadily. °Avoid periods of inactivity such as sitting longer than an hour when not asleep. This helps prevent blood clots.  °Do not drive a car for 6 weeks or until released by your surgeon.  °Do not drive while taking narcotics. ° °TED HOSE STOCKINGS °Wear the elastic stockings on both legs for three weeks following surgery during the day. You may remove them at night while sleeping. ° °WEIGHT BEARING °Weight bearing as tolerated with assist device (Hindes, cane, etc) as directed, use it as long as suggested by your surgeon or therapist, typically at least 4-6 weeks. ° °POSTOPERATIVE CONSTIPATION PROTOCOL °Constipation - defined medically as fewer than three stools per week and severe constipation as less than one stool per week. ° °One of the most common issues patients have following surgery is constipation.  Even if you have a regular bowel pattern at home, your normal regimen is likely to be disrupted due to multiple reasons following surgery.  Combination of anesthesia, postoperative narcotics, change in appetite and fluid intake all can affect your bowels.    In order to avoid complications following surgery, here are some recommendations in order to help you during your recovery period. ° °Colace (docusate) - Pick up an over-the-counter form of Colace or another stool softener and take twice a day as long as you are requiring postoperative pain medications.  Take with a full glass of water daily.  If  you experience loose stools or diarrhea, hold the colace until you stool forms back up.  If your symptoms do not get better within 1 week or if they get worse, check with your doctor. °Dulcolax (bisacodyl) - Pick up over-the-counter and take as directed by the product packaging as needed to assist with the movement of your bowels.  Take with a full glass of water.  Use this product as needed if not relieved by Colace only.  °MiraLax (polyethylene glycol) - Pick up over-the-counter to have on hand.  MiraLax is a solution that will increase the amount of water in your bowels to assist with bowel movements.  Take as directed and can mix with a glass of water, juice, soda, coffee, or tea.  Take if you go more than two days without a movement.Do not use MiraLax more than once per day. Call your doctor if you are still constipated or irregular after using this medication for 7 days in a row. ° °If you continue to have problems with postoperative constipation, please contact the office for further assistance and recommendations.  If you experience "the worst abdominal pain ever" or develop nausea or vomiting, please contact the office immediatly for further recommendations for treatment. ° °ITCHING ° If you experience itching with your medications, try taking only a single pain pill, or even half a pain pill at a time.  You can also use Benadryl over the counter for itching or also to help with sleep.  ° °MEDICATIONS °See your medication summary on the “After Visit Summary” that the nursing staff will review with you prior to discharge.  You may have some home medications which will be placed on hold until you complete the course of blood thinner medication.  It is important for you to complete the blood thinner medication as prescribed by your surgeon.  Continue your approved medications as instructed at time of discharge. ° °PRECAUTIONS °If you experience chest pain or shortness of breath - call 911 immediately for  transfer to the hospital emergency department.  °If you develop a fever greater that 101 F, purulent drainage from wound, increased redness or drainage from wound, foul odor from the wound/dressing, or calf pain - CONTACT YOUR SURGEON.   °                                                °FOLLOW-UP APPOINTMENTS °Make sure you keep all of your appointments after your operation with your surgeon and caregivers. You should call the office at the above phone number and make an appointment for approximately two weeks after the date of your surgery or on the date instructed by your surgeon outlined in the "After Visit Summary". ° °RANGE OF MOTION AND STRENGTHENING EXERCISES  °These exercises are designed to help you keep full movement of your hip joint. Follow your caregiver's or physical therapist's instructions. Perform all exercises about fifteen times, three times per day or as directed. Exercise both hips, even if you have had only   one joint replacement. These exercises can be done on a training (exercise) mat, on the floor, on a table or on a bed. Use whatever works the best and is most comfortable for you. Use music or television while you are exercising so that the exercises are a pleasant break in your day. This will make your life better with the exercises acting as a break in routine you can look forward to.  °Lying on your back, slowly slide your foot toward your buttocks, raising your knee up off the floor. Then slowly slide your foot back down until your leg is straight again.  °Lying on your back spread your legs as far apart as you can without causing discomfort.  °Lying on your side, raise your upper leg and foot straight up from the floor as far as is comfortable. Slowly lower the leg and repeat.  °Lying on your back, tighten up the muscle in the front of your thigh (quadriceps muscles). You can do this by keeping your leg straight and trying to raise your heel off the floor. This helps strengthen the  largest muscle supporting your knee.  °Lying on your back, tighten up the muscles of your buttocks both with the legs straight and with the knee bent at a comfortable angle while keeping your heel on the floor.  ° °POST-OPERATIVE OPIOID TAPER INSTRUCTIONS: °It is important to wean off of your opioid medication as soon as possible. If you do not need pain medication after your surgery it is ok to stop day one. °Opioids include: °Codeine, Hydrocodone(Norco, Vicodin), Oxycodone(Percocet, oxycontin) and hydromorphone amongst others.  °Long term and even short term use of opiods can cause: °Increased pain response °Dependence °Constipation °Depression °Respiratory depression °And more.  °Withdrawal symptoms can include °Flu like symptoms °Nausea, vomiting °And more °Techniques to manage these symptoms °Hydrate well °Eat regular healthy meals °Stay active °Use relaxation techniques(deep breathing, meditating, yoga) °Do Not substitute Alcohol to help with tapering °If you have been on opioids for less than two weeks and do not have pain than it is ok to stop all together.  °Plan to wean off of opioids °This plan should start within one week post op of your joint replacement. °Maintain the same interval or time between taking each dose and first decrease the dose.  °Cut the total daily intake of opioids by one tablet each day °Next start to increase the time between doses. °The last dose that should be eliminated is the evening dose.  ° °IF YOU ARE TRANSFERRED TO A SKILLED REHAB FACILITY °If the patient is transferred to a skilled rehab facility following release from the hospital, a list of the current medications will be sent to the facility for the patient to continue.  When discharged from the skilled rehab facility, please have the facility set up the patient's Home Health Physical Therapy prior to being released. Also, the skilled facility will be responsible for providing the patient with their medications at time of  release from the facility to include their pain medication, the muscle relaxants, and their blood thinner medication. If the patient is still at the rehab facility at time of the two week follow up appointment, the skilled rehab facility will also need to assist the patient in arranging follow up appointment in our office and any transportation needs. ° °MAKE SURE YOU:  °Understand these instructions.  °Get help right away if you are not doing well or get worse.  ° ° °DENTAL ANTIBIOTICS: ° °In most   cases prophylactic antibiotics for Dental procdeures after total joint surgery are not necessary. ° °Exceptions are as follows: ° °1. History of prior total joint infection ° °2. Severely immunocompromised (Organ Transplant, cancer chemotherapy, Rheumatoid biologic °meds such as Humera) ° °3. Poorly controlled diabetes (A1C &gt; 8.0, blood glucose over 200) ° °If you have one of these conditions, contact your surgeon for an antibiotic prescription, prior to your °dental procedure.  ° ° °Pick up stool softner and laxative for home use following surgery while on pain medications. °Do not submerge incision under water. °Please use good hand washing techniques while changing dressing each day. °May shower starting three days after surgery. °Please use a clean towel to pat the incision dry following showers. °Continue to use ice for pain and swelling after surgery. °Do not use any lotions or creams on the incision until instructed by your surgeon. ° °

## 2021-07-13 NOTE — Op Note (Signed)
OPERATIVE REPORT- TOTAL HIP ARTHROPLASTY   PREOPERATIVE DIAGNOSIS: Osteoarthritis of the Left hip.   POSTOPERATIVE DIAGNOSIS: Osteoarthritis of the Left  hip.   PROCEDURE: Left total hip arthroplasty, anterior approach.   SURGEON: Gaynelle Arabian, MD   ASSISTANT: Fenton Foy, PA-C  ANESTHESIA:  Spinal  ESTIMATED BLOOD LOSS:-200 mL    DRAINS: Hemovac x1.   COMPLICATIONS: None   CONDITION: PACU - hemodynamically stable.   BRIEF CLINICAL NOTE: Colleen Mcgrath is a 80 y.o. female who has advanced end-  stage arthritis of their Left  hip with progressively worsening pain and  dysfunction.The patient has failed nonoperative management and presents for  total hip arthroplasty.   PROCEDURE IN DETAIL: After successful administration of spinal  anesthetic, the traction boots for the [facility] bed were placed on both  feet and the patient was placed onto the [facility] bed, boots placed into the leg  holders. The Left hip was then isolated from the perineum with plastic  drapes and prepped and draped in the usual sterile fashion. ASIS and  greater trochanter were marked and a oblique incision was made, starting  at about 1 cm lateral and 2 cm distal to the ASIS and coursing towards  the anterior cortex of the femur. The skin was cut with a 10 blade  through subcutaneous tissue to the level of the fascia overlying the  tensor fascia lata muscle. The fascia was then incised in line with the  incision at the junction of the anterior third and posterior 2/3rd. The  muscle was teased off the fascia and then the interval between the TFL  and the rectus was developed. The Hohmann retractor was then placed at  the top of the femoral neck over the capsule. The vessels overlying the  capsule were cauterized and the fat on top of the capsule was removed.  A Hohmann retractor was then placed anterior underneath the rectus  femoris to give exposure to the entire anterior capsule. A T-shaped   capsulotomy was performed. The edges were tagged and the femoral head  was identified.       Osteophytes are removed off the superior acetabulum.  The femoral neck was then cut in situ with an oscillating saw. Traction  was then applied to the left lower extremity utilizing the [facility]  traction. The femoral head was then removed. Retractors were placed  around the acetabulum and then circumferential removal of the labrum was  performed. Osteophytes were also removed. Reaming starts at 45 mm to  medialize and  Increased in 2 mm increments to 49 mm. We reamed in  approximately 40 degrees of abduction, 20 degrees anteversion. A 50 mm  pinnacle acetabular shell was then impacted in anatomic position under  fluoroscopic guidance with excellent purchase. We did not need to place  any additional dome screws. A 32 mm neutral + 4 marathon liner was then  placed into the acetabular shell.       The femoral lift was then placed along the lateral aspect of the femur  just distal to the vastus ridge. The leg was  externally rotated and capsule  was stripped off the inferior aspect of the femoral neck down to the  level of the lesser trochanter, this was done with electrocautery. The femur was lifted after this was performed. The  leg was then placed in an extended and adducted position essentially delivering the femur. We also removed the capsule superiorly and the piriformis from the piriformis fossa  to gain excellent exposure of the  proximal femur. Rongeur was used to remove some cancellous bone to get  into the lateral portion of the proximal femur for placement of the  initial starter reamer. The starter broaches was placed  the starter broach  and was shown to go down the center of the canal. Broaching  with the Actis system was then performed starting at size 0  coursing  Up to size 4. A size 4 had excellent torsional and rotational  and axial stability. The trial high offset neck was then placed   with a 32 + 1 trial head. The hip was then reduced. We confirmed that  the stem was in the canal both on AP and lateral x-rays. It also has excellent sizing. The hip was reduced with outstanding stability through full extension and full external rotation.. AP pelvis was taken and the leg lengths were measured and found to be equal. Hip was then dislocated again and the femoral head and neck removed. The  femoral broach was removed. Size 4 Actis stem with a high offset  neck was then impacted into the femur following native anteversion. Has  excellent purchase in the canal. Excellent torsional and rotational and  axial stability. It is confirmed to be in the canal on AP and lateral  fluoroscopic views. The 32 + 1 metal head was placed and the hip  reduced with outstanding stability. Again AP pelvis was taken and it  confirmed that the leg lengths were equal. The wound was then copiously  irrigated with saline solution and the capsule reattached and repaired  with Ethibond suture. 30 ml of .25% Bupivicaine was  injected into the capsule and into the edge of the tensor fascia lata as well as subcutaneous tissue. The fascia overlying the tensor fascia lata was then closed with a running #1 V-Loc. Subcu was closed with interrupted 2-0 Vicryl and subcuticular running 4-0 Monocryl. Incision was cleaned  and dried. Steri-Strips and a bulky sterile dressing applied. The patient was awakened and transported to  recovery in stable condition.        Please note that a surgical assistant was a medical necessity for this procedure to perform it in a safe and expeditious manner. Assistant was necessary to provide appropriate retraction of vital neurovascular structures and to prevent femoral fracture and allow for anatomic placement of the prosthesis.  Gaynelle Arabian, M.D.

## 2021-07-14 LAB — CBC
HCT: 34.9 % — ABNORMAL LOW (ref 36.0–46.0)
Hemoglobin: 11.4 g/dL — ABNORMAL LOW (ref 12.0–15.0)
MCH: 31.3 pg (ref 26.0–34.0)
MCHC: 32.7 g/dL (ref 30.0–36.0)
MCV: 95.9 fL (ref 80.0–100.0)
Platelets: 320 10*3/uL (ref 150–400)
RBC: 3.64 MIL/uL — ABNORMAL LOW (ref 3.87–5.11)
RDW: 13.4 % (ref 11.5–15.5)
WBC: 15.5 10*3/uL — ABNORMAL HIGH (ref 4.0–10.5)
nRBC: 0 % (ref 0.0–0.2)

## 2021-07-14 LAB — BASIC METABOLIC PANEL
Anion gap: 5 (ref 5–15)
BUN: 20 mg/dL (ref 8–23)
CO2: 23 mmol/L (ref 22–32)
Calcium: 8.7 mg/dL — ABNORMAL LOW (ref 8.9–10.3)
Chloride: 108 mmol/L (ref 98–111)
Creatinine, Ser: 1.04 mg/dL — ABNORMAL HIGH (ref 0.44–1.00)
GFR, Estimated: 55 mL/min — ABNORMAL LOW (ref >60)
Glucose, Bld: 190 mg/dL — ABNORMAL HIGH (ref 70–99)
Potassium: 4.4 mmol/L (ref 3.5–5.1)
Sodium: 136 mmol/L (ref 135–145)

## 2021-07-14 LAB — GLUCOSE, CAPILLARY
Glucose-Capillary: 161 mg/dL — ABNORMAL HIGH (ref 70–99)
Glucose-Capillary: 240 mg/dL — ABNORMAL HIGH (ref 70–99)
Glucose-Capillary: 276 mg/dL — ABNORMAL HIGH (ref 70–99)
Glucose-Capillary: 165 mg/dL — ABNORMAL HIGH (ref 70–99)

## 2021-07-14 MED ORDER — TRAMADOL HCL 50 MG PO TABS: 50.0000 mg | ORAL_TABLET | Freq: Four times a day (QID) | ORAL | 0 refills | Status: AC | PRN

## 2021-07-14 MED ORDER — METHOCARBAMOL 500 MG PO TABS: 500.0000 mg | ORAL_TABLET | Freq: Four times a day (QID) | ORAL | 0 refills | Status: AC | PRN

## 2021-07-14 MED ORDER — HYDROCODONE-ACETAMINOPHEN 5-325 MG PO TABS: 1.0000 | ORAL_TABLET | Freq: Four times a day (QID) | ORAL | 0 refills | Status: DC | PRN

## 2021-07-14 NOTE — Progress Notes (Signed)
   Subjective: 1 Day Post-Op Procedure(s) (LRB): TOTAL HIP ARTHROPLASTY ANTERIOR APPROACH (Left) Patient reports pain as mild.   Patient seen in rounds by Dr. Wynelle Link. Patient is well, and has had no acute complaints or problems. Denies SOB, chest pain, or calf pain. No acute overnight events. Was unable to ambulate with therapy yesterday. Will continue therapy today. Foley pulled this am.    Objective: Vital signs in last 24 hours: Temp:  [97.5 F (36.4 C)-99.3 F (37.4 C)] 99.3 F (37.4 C) (09/29 0543) Pulse Rate:  [52-95] 95 (09/29 0543) Resp:  [14-23] 16 (09/29 0543) BP: (91-149)/(60-98) 139/96 (09/29 0543) SpO2:  [92 %-99 %] 92 % (09/29 0543)  Intake/Output from previous day:  Intake/Output Summary (Last 24 hours) at 07/14/2021 0753 Last data filed at 07/14/2021 0543 Gross per 24 hour  Intake 3913.75 ml  Output 3300 ml  Net 613.75 ml     Intake/Output this shift: No intake/output data recorded.  Labs: Recent Labs    07/14/21 0329  HGB 11.4*   Recent Labs    07/14/21 0329  WBC 15.5*  RBC 3.64*  HCT 34.9*  PLT 320   Recent Labs    07/14/21 0329  NA 136  K 4.4  CL 108  CO2 23  BUN 20  CREATININE 1.04*  GLUCOSE 190*  CALCIUM 8.7*   No results for input(s): LABPT, INR in the last 72 hours.  Exam: General - Patient is Alert and Oriented Extremity - Neurologically intact Neurovascular intact Intact pulses distally Dorsiflexion/Plantar flexion intact Dressing - dressing C/D/I Motor Function - intact, moving foot and toes well on exam.   Past Medical History:  Diagnosis Date   Arthritis    Atrial fibrillation ([place]) 2015   Cancer ([place])    hx of skin cancer   Diabetes mellitus without complication ([facility])    Hyperlipidemia    Hypertension    OSA (obstructive sleep apnea) 2017   no cpap   PONV (postoperative nausea and vomiting)    at age 66 when had appendex out    Assessment/Plan: 1 Day Post-Op Procedure(s) (LRB): TOTAL HIP ARTHROPLASTY  ANTERIOR APPROACH (Left) Principal Problem:   OA (osteoarthritis) of hip Active Problems:   Primary osteoarthritis of left hip  Estimated body mass index is 31.32 kg/m as calculated from the following:   Height as of this encounter: 5' 6.5" (1.689 m).   Weight as of this encounter: 89.4 kg. Up with therapy  DVT Prophylaxis - Xarelto and TED hose Weight bearing as tolerated. Continue therapy.  Plan is to go Home after hospital stay.   Plan for two sessions with PT this morning, and if meeting goals, will plan for discharge this afternoon.   Patient to follow up in two weeks with Dr. Wynelle Link in clinic.   The PDMP database was reviewed today prior to any opioid medications being prescribed to this patient.Fenton Foy, [place], PA-C Orthopedic Surgery [phone] 07/14/2021, 7:53 AM

## 2021-07-14 NOTE — Progress Notes (Signed)
Physical Therapy Treatment Patient Details Name: Colleen Mcgrath MRN: [id number] DOB: 1941/04/20 Today's Date: 07/14/2021   History of Present Illness 80 y.o. female admitted 07/13/21 for L AA-THA. PMH includes afib, HTN, DM, OSA.    PT Comments    Pt again was unsteady in standing with first attempt of sit to stand, she was unable to get centered 2* posterior and L lateral lean.  On second attempt she was steady and able to ambulate 29' with RW, with assist to advance LLE and max verbal/manual cuing for technique with RW. Performed THA exercises, daughter present for session. She is not ready to DC home from a PT standpoint.     Recommendations for follow up therapy are one component of a multi-disciplinary discharge planning process, led by the attending physician.  Recommendations may be updated based on patient status, additional functional criteria and insurance authorization.  Follow Up Recommendations  Follow surgeon's recommendation for DC plan and follow-up therapies     Equipment Recommendations  Rolling Rolf with 5" wheels    Recommendations for Other Services       Precautions / Restrictions Precautions Precautions: Fall Restrictions Weight Bearing Restrictions: No Other Position/Activity Restrictions: WBAT LLE     Mobility  Bed Mobility Overal bed mobility: Needs Assistance Bed Mobility: Supine to Sit     Supine to sit: Min assist     General bed mobility comments: assist for LLE OOB, VCs technique    Transfers Overall transfer level: Needs assistance Equipment used: Rolling Levings (2 wheeled) Transfers: Sit to/from Stand Sit to Stand: From elevated surface;Min assist         General transfer comment: sit to stand x 2 trials. Pt unable to get her balance on first trial, she was leaning posteriorly and to L. Second trial she was able to get her balance and then ambulate.  Ambulation/Gait Ambulation/Gait assistance: Mod assist;Min assist Gait  Distance (Feet): 15 Feet Assistive device: Rolling Flavell (2 wheeled) Gait Pattern/deviations: Step-to pattern;Decreased stride length Gait velocity: decr   General Gait Details: max VCs for sequencing and positioning in RW, at times needs assist to advance LLE, increased time & effort, pain/fatigue limiting distance   Stairs             Wheelchair Mobility    Modified Rankin (Stroke Patients Only)       Balance Overall balance assessment: Needs assistance Sitting-balance support: Feet unsupported;No upper extremity supported Sitting balance-Leahy Scale: Good     Standing balance support: Bilateral upper extremity supported Standing balance-Leahy Scale: Poor Standing balance comment: heavy reliance on UE support                            Cognition Arousal/Alertness: Awake/alert Behavior During Therapy: WFL for tasks assessed/performed Overall Cognitive Status: Within Functional Limits for tasks assessed                                        Exercises Total Joint Exercises Ankle Circles/Pumps: AROM;Both;10 reps;Supine Quad Sets: AROM;Left;5 reps;Supine Short Arc Quad: AROM;Left;Supine;5 reps Heel Slides: AAROM;Left;Supine;20 reps Hip ABduction/ADduction: AAROM;Left;Supine;20 reps    General Comments        Pertinent Vitals/Pain Pain Score: 8  Pain Location: L hip Pain Descriptors / Indicators: Operative site guarding;Sore Pain Intervention(s): Limited activity within patient's tolerance;Monitored during session;Premedicated before session;Ice applied    Home Living  Prior Function            PT Goals (current goals can now be found in the care plan section) Acute Rehab PT Goals Patient Stated Goal: walking & hiking PT Goal Formulation: With patient/family Time For Goal Achievement: 07/20/21 Potential to Achieve Goals: Good Progress towards PT goals: Progressing toward goals     Frequency    7X/week      PT Plan Current plan remains appropriate    Co-evaluation              AM-PAC PT "6 Clicks" Mobility   Outcome Measure  Help needed turning from your back to your side while in a flat bed without using bedrails?: A Little Help needed moving from lying on your back to sitting on the side of a flat bed without using bedrails?: A Lot Help needed moving to and from a bed to a chair (including a wheelchair)?: A Lot Help needed standing up from a chair using your arms (e.g., wheelchair or bedside chair)?: A Lot Help needed to walk in hospital room?: A Lot Help needed climbing 3-5 steps with a railing? : Total 6 Click Score: 12    End of Session Equipment Utilized During Treatment: Gait belt Activity Tolerance: Patient tolerated treatment well Patient left: with call bell/phone within reach;with family/visitor present;in chair;with chair alarm set Nurse Communication: Mobility status PT Visit Diagnosis: Difficulty in walking, not elsewhere classified (R26.2);Pain Pain - Right/Left: Left Pain - part of body: Hip     Time: 3557-3220 PT Time Calculation (min) (ACUTE ONLY): 40 min  Charges:  $Gait Training: 8-22 mins $Therapeutic Exercise: 8-22 mins $Therapeutic Activity: 8-22 mins                    Blondell Reveal Kistler PT 07/14/2021  Acute Rehabilitation Services Pager [phone] Office [phone]

## 2021-07-14 NOTE — TOC Transition Note (Signed)
Transition of Care (TOC) - CM/SW Discharge Note  Patient Details  Name: Colleen Mcgrath MRN: [id number] Date of Birth: 11/07/1940  Transition of Care (TOC) CM/SW Contact:  Megan S Glenn, LCSW Phone Number: 07/14/2021, 10:06 AM  Clinical Narrative: Patient is expected to discharge home after working with PT. CSW met with patient to confirm discharge plan and needs. Patient will discharge home with a home exercise program (HEP). Patient will need a rolling Ryant, but declined to private pay for the 3N1 per [employer]. [employer] to deliver Servais to patient's room.  Final next level of care: Home/Self Care Barriers to Discharge: No Barriers Identified  Patient Goals and CMS Choice Patient states their goals for this hospitalization and ongoing recovery are:: Discharge home with HEP CMS Medicare.gov Compare Post Acute Care list provided to:: Patient Choice offered to / list presented to : Patient  Discharge Plan and Services        DME Arranged: Want rolling DME Agency: [employer] Representative spoke with at DME Agency: Prearranged in orthopedist's office  Readmission Risk Interventions No flowsheet data found.     

## 2021-07-14 NOTE — Progress Notes (Signed)
Pt having increased confusion this evening. Also having difficulty moving left leg when trying to transfer to [facility] which is a decline from earlier in the day. Pt alert and following commands, able to move all extremities when in bed, but unable to tell me date and situation. VSS, CBG 165. Notified on-call for [facility]. Received call back from [place]. PA-C. No new orders at this time, just continue to monitor. Pt resting comfortably in bed, daughter at pts bedside.

## 2021-07-14 NOTE — Progress Notes (Addendum)
Physical Therapy Treatment Patient Details Name: Colleen Mcgrath MRN: [id number] DOB: [birth year] Today's Date: 07/14/2021   History of Present Illness 80 y.o. female admitted 07/13/21 for L AA-THA. PMH includes afib, HTN, DM, OSA.    PT Comments    Pt is progressing slowly with mobility. On first 2 attempts of sit to stand, she had a heavy posterior lean and required mod assist to maintain balance, was unable to weight shift. On third attempt she was able to get her balance and then ambulate to the bathroom with increased time and max verbal cues for sequencing/positioning with RW, as well as physical assist to advance LLE. Instructed pt/daughter in HEP. I do not expect she will be ready to DC home today even after second session this afternoon.    Recommendations for follow up therapy are one component of a multi-disciplinary discharge planning process, led by the attending physician.  Recommendations may be updated based on patient status, additional functional criteria and insurance authorization.  Follow Up Recommendations  Follow surgeon's recommendation for DC plan and follow-up therapies     Equipment Recommendations  Rolling Lahmann with 5" wheels    Recommendations for Other Services       Precautions / Restrictions Precautions Precautions: Fall Restrictions Weight Bearing Restrictions: No Other Position/Activity Restrictions: WBAT LLE     Mobility  Bed Mobility Overal bed mobility: Needs Assistance Bed Mobility: Supine to Sit     Supine to sit: Min assist     General bed mobility comments: assist for LLE OOB, VCs technique    Transfers Overall transfer level: Needs assistance Equipment used: Rolling [place] (2 wheeled) Transfers: Sit to/from Stand Sit to Stand: From elevated surface;Mod assist         General transfer comment: sit to stand x 4 trials. On first 2 pt had significant posterior lean requiring mod A, could not weight shift. Pt reported some  dizziness and cold sweats. In sitting, BP 134/90, HR 91-125 (noted h/o afib), SaO2 97% on room air.  Then 3rd attempt she was able to get her balance and ambulate to the bathroom.  Ambulation/Gait Ambulation/Gait assistance: Mod assist;Min assist Gait Distance (Feet): 18 Feet Assistive device: Rolling Honold (2 wheeled) Gait Pattern/deviations: Step-to pattern;Decreased stride length Gait velocity: decr   General Gait Details: max VCs for sequencing and positioning in RW, at times needs assist to advance LLE, increased time & effort, pain limiting distance   Stairs             Wheelchair Mobility    Modified Rankin (Stroke Patients Only)       Balance Overall balance assessment: Needs assistance Sitting-balance support: Feet unsupported;No upper extremity supported Sitting balance-Leahy Scale: Good     Standing balance support: Bilateral upper extremity supported Standing balance-Leahy Scale: Poor Standing balance comment: heavy reliance on UE support                            Cognition Arousal/Alertness: Awake/alert Behavior During Therapy: WFL for tasks assessed/performed Overall Cognitive Status: Within Functional Limits for tasks assessed                                        Exercises Total Joint Exercises Ankle Circles/Pumps: AROM;Both;10 reps;Supine Quad Sets: AROM;Left;5 reps;Supine Short Arc Quad: AROM;Left;Supine;5 reps Heel Slides: AAROM;Left;10 reps;Supine Hip ABduction/ADduction: AAROM;Left;10 reps;Supine    General  Comments        Pertinent Vitals/Pain Pain Score: 10-Worst pain ever Pain Location: L hip Pain Descriptors / Indicators: Operative site guarding;Sore Pain Intervention(s): Limited activity within patient's tolerance;Monitored during session;Premedicated before session;Ice applied    Home Living                      Prior Function            PT Goals (current goals can now be found in  the care plan section) Acute Rehab PT Goals Patient Stated Goal: walking & hiking PT Goal Formulation: With patient/family Time For Goal Achievement: 07/20/21 Potential to Achieve Goals: Good Progress towards PT goals: Progressing toward goals    Frequency    7X/week      PT Plan Current plan remains appropriate    Co-evaluation              AM-PAC PT "6 Clicks" Mobility   Outcome Measure  Help needed turning from your back to your side while in a flat bed without using bedrails?: A Little Help needed moving from lying on your back to sitting on the side of a flat bed without using bedrails?: A Lot Help needed moving to and from a bed to a chair (including a wheelchair)?: A Lot Help needed standing up from a chair using your arms (e.g., wheelchair or bedside chair)?: A Lot Help needed to walk in hospital room?: A Lot Help needed climbing 3-5 steps with a railing? : Total 6 Click Score: 12    End of Session Equipment Utilized During Treatment: Gait belt Activity Tolerance: Patient tolerated treatment well Patient left: with call bell/phone within reach;with family/visitor present;in chair;with chair alarm set Nurse Communication: Mobility status PT Visit Diagnosis: Difficulty in walking, not elsewhere classified (R26.2);Pain Pain - Right/Left: Left Pain - part of body: Hip     Time: 0925-1019 PT Time Calculation (min) (ACUTE ONLY): 54 min  Charges:  $Gait Training: 23-37 mins $Therapeutic Exercise: 8-22 mins $Therapeutic Activity: 8-22 mins                     Blondell Reveal Kistler PT 07/14/2021  Acute Rehabilitation Services Pager [phone] Office [phone]

## 2021-07-15 DIAGNOSIS — Z85828 Personal history of other malignant neoplasm of skin: Secondary | ICD-10-CM | POA: Diagnosis not present

## 2021-07-15 DIAGNOSIS — Z794 Long term (current) use of insulin: Secondary | ICD-10-CM | POA: Diagnosis not present

## 2021-07-15 DIAGNOSIS — I4811 Longstanding persistent atrial fibrillation: Secondary | ICD-10-CM | POA: Diagnosis not present

## 2021-07-15 DIAGNOSIS — Z8249 Family history of ischemic heart disease and other diseases of the circulatory system: Secondary | ICD-10-CM | POA: Diagnosis not present

## 2021-07-15 DIAGNOSIS — Z7901 Long term (current) use of anticoagulants: Secondary | ICD-10-CM | POA: Diagnosis not present

## 2021-07-15 DIAGNOSIS — Z882 Allergy status to sulfonamides status: Secondary | ICD-10-CM | POA: Diagnosis not present

## 2021-07-15 DIAGNOSIS — I10 Essential (primary) hypertension: Secondary | ICD-10-CM | POA: Diagnosis not present

## 2021-07-15 DIAGNOSIS — Z888 Allergy status to other drugs, medicaments and biological substances status: Secondary | ICD-10-CM | POA: Diagnosis not present

## 2021-07-15 DIAGNOSIS — Z881 Allergy status to other antibiotic agents status: Secondary | ICD-10-CM | POA: Diagnosis not present

## 2021-07-15 DIAGNOSIS — E119 Type 2 diabetes mellitus without complications: Secondary | ICD-10-CM | POA: Diagnosis not present

## 2021-07-15 DIAGNOSIS — Z79899 Other long term (current) drug therapy: Secondary | ICD-10-CM | POA: Diagnosis not present

## 2021-07-15 DIAGNOSIS — Z87891 Personal history of nicotine dependence: Secondary | ICD-10-CM | POA: Diagnosis not present

## 2021-07-15 DIAGNOSIS — Z7984 Long term (current) use of oral hypoglycemic drugs: Secondary | ICD-10-CM | POA: Diagnosis not present

## 2021-07-15 DIAGNOSIS — M1612 Unilateral primary osteoarthritis, left hip: Secondary | ICD-10-CM | POA: Diagnosis not present

## 2021-07-15 DIAGNOSIS — G4733 Obstructive sleep apnea (adult) (pediatric): Secondary | ICD-10-CM | POA: Diagnosis not present

## 2021-07-15 LAB — CBC
HCT: 35.4 % — ABNORMAL LOW (ref 36.0–46.0)
Hemoglobin: 11.5 g/dL — ABNORMAL LOW (ref 12.0–15.0)
MCH: 30.4 pg (ref 26.0–34.0)
MCHC: 32.5 g/dL (ref 30.0–36.0)
MCV: 93.7 fL (ref 80.0–100.0)
Platelets: 317 10*3/uL (ref 150–400)
RBC: 3.78 MIL/uL — ABNORMAL LOW (ref 3.87–5.11)
RDW: 13.3 % (ref 11.5–15.5)
WBC: 17.3 10*3/uL — ABNORMAL HIGH (ref 4.0–10.5)
nRBC: 0 % (ref 0.0–0.2)

## 2021-07-15 LAB — BASIC METABOLIC PANEL
Anion gap: 6 (ref 5–15)
BUN: 14 mg/dL (ref 8–23)
CO2: 22 mmol/L (ref 22–32)
Calcium: 9.3 mg/dL (ref 8.9–10.3)
Chloride: 108 mmol/L (ref 98–111)
Creatinine, Ser: 1 mg/dL (ref 0.44–1.00)
GFR, Estimated: 58 mL/min — ABNORMAL LOW (ref >60)
Glucose, Bld: 176 mg/dL — ABNORMAL HIGH (ref 70–99)
Potassium: 4.6 mmol/L (ref 3.5–5.1)
Sodium: 136 mmol/L (ref 135–145)

## 2021-07-15 LAB — GLUCOSE, CAPILLARY
Glucose-Capillary: 147 mg/dL — ABNORMAL HIGH (ref 70–99)
Glucose-Capillary: 191 mg/dL — ABNORMAL HIGH (ref 70–99)

## 2021-07-15 NOTE — Progress Notes (Signed)
Physical Therapy Treatment Patient Details Name: Colleen Mcgrath MRN: [id number] DOB: 1941-02-24 Today's Date: 07/15/2021   History of Present Illness 80 y.o. female admitted 07/13/21 for L AA-THA. PMH includes afib, HTN, DM, OSA.    PT Comments    Pt very cooperative and requiring increased time for tasks but with noted progress with mobility and up to ambulate 49' in hall.  Pt pleased with progress.   Recommendations for follow up therapy are one component of a multi-disciplinary discharge planning process, led by the attending physician.  Recommendations may be updated based on patient status, additional functional criteria and insurance authorization.  Follow Up Recommendations  Follow surgeon's recommendation for DC plan and follow-up therapies     Equipment Recommendations  Rolling Garske with 5" wheels    Recommendations for Other Services       Precautions / Restrictions Precautions Precautions: Fall Restrictions Weight Bearing Restrictions: No Other Position/Activity Restrictions: WBAT LLE     Mobility  Bed Mobility               General bed mobility comments: Up in chair and requests back to same    Transfers Overall transfer level: Needs assistance Equipment used: Rolling [place]y (2 wheeled) Transfers: Sit to/from Stand Sit to Stand: Min assist         General transfer comment: cues for LE management and use of UEs to self assist  Ambulation/Gait Ambulation/Gait assistance: Min assist Gait Distance (Feet): 49 Feet Assistive device: Rolling Shankel (2 wheeled) Gait Pattern/deviations: Step-to pattern;Decreased stride length Gait velocity: decr   General Gait Details: cues for posture, position from RW and sequence   Stairs             Wheelchair Mobility    Modified Rankin (Stroke Patients Only)       Balance Overall balance assessment: Needs assistance Sitting-balance support: No upper extremity supported;Feet supported Sitting  balance-Leahy Scale: Good     Standing balance support: Bilateral upper extremity supported Standing balance-Leahy Scale: Poor                              Cognition Arousal/Alertness: Awake/alert Behavior During Therapy: WFL for tasks assessed/performed Overall Cognitive Status: Within Functional Limits for tasks assessed                                        Exercises Total Joint Exercises Ankle Circles/Pumps: AROM;Both;Supine;15 reps Quad Sets: AROM;Supine;10 reps;Both Heel Slides: AAROM;Left;Supine;20 reps Hip ABduction/ADduction: AAROM;Left;Supine;15 reps    General Comments        Pertinent Vitals/Pain Pain Assessment: 0-10 Pain Score: 6  Pain Location: L hip Pain Descriptors / Indicators: Operative site guarding;Sore Pain Intervention(s): Limited activity within patient's tolerance;Monitored during session;Premedicated before session;Ice applied    Home Living                      Prior Function            PT Goals (current goals can now be found in the care plan section) Acute Rehab PT Goals Patient Stated Goal: walking & hiking PT Goal Formulation: With patient/family Time For Goal Achievement: 07/20/21 Potential to Achieve Goals: Good Progress towards PT goals: Progressing toward goals    Frequency    7X/week      PT Plan Current plan remains appropriate  Co-evaluation              AM-PAC PT "6 Clicks" Mobility   Outcome Measure  Help needed turning from your back to your side while in a flat bed without using bedrails?: A Little Help needed moving from lying on your back to sitting on the side of a flat bed without using bedrails?: A Lot Help needed moving to and from a bed to a chair (including a wheelchair)?: A Lot Help needed standing up from a chair using your arms (e.g., wheelchair or bedside chair)?: A Little Help needed to walk in hospital room?: A Little Help needed climbing 3-5 steps  with a railing? : A Lot 6 Click Score: 15    End of Session Equipment Utilized During Treatment: Gait belt Activity Tolerance: Patient tolerated treatment well Patient left: with call bell/phone within reach;with family/visitor present;in chair;with chair alarm set Nurse Communication: Mobility status PT Visit Diagnosis: Difficulty in walking, not elsewhere classified (R26.2);Pain Pain - Right/Left: Left Pain - part of body: Hip     Time: 1005-1035 PT Time Calculation (min) (ACUTE ONLY): 30 min  Charges:  $Gait Training: 8-22 mins $Therapeutic Exercise: 8-22 mins                     [place] Pager [phone] Office [phone]    Rowena Moilanen 07/15/2021, 12:41 PM

## 2021-07-15 NOTE — Progress Notes (Signed)
   Subjective: 2 Days Post-Op Procedure(s) (LRB): TOTAL HIP ARTHROPLASTY ANTERIOR APPROACH (Left) Patient reports pain as mild.   Patient seen in rounds by Dr. Wynelle Link. Patient having issues with confusion yesterday evening and slightly this AM. Discontinued hydrocodone, will continue to monitor.  Plan is to go Home after hospital stay.  Objective: Vital signs in last 24 hours: Temp:  [98.2 F (36.8 C)-98.5 F (36.9 C)] 98.2 F (36.8 C) (09/30 0631) Pulse Rate:  [90-110] 94 (09/30 0631) Resp:  [16-20] 16 (09/30 0631) BP: (148-170)/(87-104) 170/104 (09/30 0631) SpO2:  [91 %-94 %] 93 % (09/30 0631)  Intake/Output from previous day:  Intake/Output Summary (Last 24 hours) at 07/15/2021 0756 Last data filed at 07/15/2021 0600 Gross per 24 hour  Intake 621.25 ml  Output 1100 ml  Net -478.75 ml    Intake/Output this shift: No intake/output data recorded.  Labs: Recent Labs    07/14/21 0329 07/15/21 0329  HGB 11.4* 11.5*   Recent Labs    07/14/21 0329 07/15/21 0329  WBC 15.5* 17.3*  RBC 3.64* 3.78*  HCT 34.9* 35.4*  PLT 320 317   Recent Labs    07/14/21 0329 07/15/21 0329  NA 136 136  K 4.4 4.6  CL 108 108  CO2 23 22  BUN 20 14  CREATININE 1.04* 1.00  GLUCOSE 190* 176*  CALCIUM 8.7* 9.3   No results for input(s): LABPT, INR in the last 72 hours.  Exam: General - Patient is Alert and Oriented Extremity - Neurologically intact Neurovascular intact Sensation intact distally Dorsiflexion/Plantar flexion intact Dressing/Incision - clean, dry, no drainage Motor Function - intact, moving foot and toes well on exam.   Past Medical History:  Diagnosis Date   Arthritis    Atrial fibrillation ([place]) 2015   Cancer (HCC)    hx of skin cancer   Diabetes mellitus without complication (HCC)    Hyperlipidemia    Hypertension    OSA (obstructive sleep apnea) 2017   no cpap   PONV (postoperative nausea and vomiting)    at age 58 when had appendex out     Assessment/Plan: 2 Days Post-Op Procedure(s) (LRB): TOTAL HIP ARTHROPLASTY ANTERIOR APPROACH (Left) Principal Problem:   OA (osteoarthritis) of hip Active Problems:   Primary osteoarthritis of left hip  Estimated body mass index is 31.32 kg/m as calculated from the following:   Height as of this encounter: 5' 6.5" (1.689 m).   Weight as of this encounter: 89.4 kg. Up with therapy  DVT Prophylaxis - Xarelto Weight-bearing as tolerated  Hydrocodone discontinued, will continue to monitor to see if confusion improves today. Will contact pharmacy to cancel prescription that was sent yesterday.  Theresa Duty, PA-C Orthopedic Surgery [phone] 07/15/2021, 7:56 AM

## 2021-07-15 NOTE — Progress Notes (Signed)
Physical Therapy Treatment Patient Details Name: Colleen Mcgrath MRN: [id number] DOB: 1941/06/06 Today's Date: 07/15/2021   History of Present Illness 80 y.o. female admitted 07/13/21 for L AA-THA. PMH includes afib, HTN, DM, OSA.    PT Comments    Pt continues cooperative and progressing well with mobility.  Pt up to ambulate increased distance in hall, reviewed bed mobility, reviewed car transfers, and reviewed HEP including written instruction with progression.  Pt and family feeling much more confident with ability to manage at home and wish to proceed with dc this date.   Recommendations for follow up therapy are one component of a multi-disciplinary discharge planning process, led by the attending physician.  Recommendations may be updated based on patient status, additional functional criteria and insurance authorization.  Follow Up Recommendations  Follow surgeon's recommendation for DC plan and follow-up therapies     Equipment Recommendations  Rolling [place] with 5" wheels    Recommendations for Other Services       Precautions / Restrictions Precautions Precautions: Fall Restrictions Weight Bearing Restrictions: No Other Position/Activity Restrictions: WBAT LLE     Mobility  Bed Mobility Overal bed mobility: Needs Assistance Bed Mobility: Supine to Sit;Sit to Supine     Supine to sit: Min assist Sit to supine: Min assist   General bed mobility comments: Increased time with cues for sequence and use of R LE to self assist    Transfers Overall transfer level: Needs assistance Equipment used: Rolling [place] (2 wheeled) Transfers: Sit to/from Stand Sit to Stand: Min guard         General transfer comment: cues for LE management and use of UEs to self assist  Ambulation/Gait Ambulation/Gait assistance: Min guard Gait Distance (Feet): 60 Feet Assistive device: Rolling [place] (2 wheeled) Gait Pattern/deviations: Step-to pattern;Step-through pattern;Decreased  step length - right;Decreased step length - left;Shuffle;Trunk flexed Gait velocity: decr   General Gait Details: cues for posture, position from RW and sequence   Stairs             Wheelchair Mobility    Modified Rankin (Stroke Patients Only)       Balance Overall balance assessment: Needs assistance Sitting-balance support: No upper extremity supported;Feet supported Sitting balance-Leahy Scale: Good     Standing balance support: No upper extremity supported Standing balance-Leahy Scale: Fair                              Cognition Arousal/Alertness: Awake/alert Behavior During Therapy: WFL for tasks assessed/performed Overall Cognitive Status: Within Functional Limits for tasks assessed                                        Exercises Total Joint Exercises Ankle Circles/Pumps: AROM;Both;Supine;15 reps Quad Sets: AROM;Supine;10 reps;Both Heel Slides: AAROM;Left;Supine;10 reps Hip ABduction/ADduction: AAROM;Left;Supine;10 reps    General Comments        Pertinent Vitals/Pain Pain Assessment: 0-10 Pain Score: 5  Pain Location: L hip Pain Descriptors / Indicators: Operative site guarding;Sore Pain Intervention(s): Limited activity within patient's tolerance;Monitored during session;Premedicated before session;Ice applied    Home Living                      Prior Function            PT Goals (current goals can now be found in the care plan  section) Acute Rehab PT Goals Patient Stated Goal: walking & hiking PT Goal Formulation: With patient/family Time For Goal Achievement: 07/20/21 Potential to Achieve Goals: Good Progress towards PT goals: Progressing toward goals    Frequency    7X/week      PT Plan Current plan remains appropriate    Co-evaluation              AM-PAC PT "6 Clicks" Mobility   Outcome Measure  Help needed turning from your back to your side while in a flat bed without using  bedrails?: A Little Help needed moving from lying on your back to sitting on the side of a flat bed without using bedrails?: A Little Help needed moving to and from a bed to a chair (including a wheelchair)?: A Little Help needed standing up from a chair using your arms (e.g., wheelchair or bedside chair)?: A Little Help needed to walk in hospital room?: A Little Help needed climbing 3-5 steps with a railing? : A Little 6 Click Score: 18    End of Session Equipment Utilized During Treatment: Gait belt Activity Tolerance: Patient tolerated treatment well Patient left: in bed;with call bell/phone within reach;with family/visitor present Nurse Communication: Mobility status PT Visit Diagnosis: Difficulty in walking, not elsewhere classified (R26.2);Pain Pain - Right/Left: Left Pain - part of body: Hip     Time: 4081-4481 PT Time Calculation (min) (ACUTE ONLY): 51 min  Charges:  $Gait Training: 8-22 mins $Therapeutic Exercise: 8-22 mins $Therapeutic Activity: 8-22 mins                     Debe Coder PT Acute Rehabilitation Services Pager [phone] Office [phone]    Ellianah Cordy 07/15/2021, 4:14 PM

## 2021-07-17 ENCOUNTER — Encounter (HOSPITAL_COMMUNITY): Payer: Self-pay | Admitting: Orthopedic Surgery

## 2021-07-20 NOTE — Discharge Summary (Signed)
Physician Discharge Summary   Patient ID: Colleen Mcgrath MRN: [id number] DOB/AGE: 1941-10-12 80 y.o.  Admit date: 07/13/2021 Discharge date: 07/15/2021  Primary Diagnosis: Osteoarthritis of Left Hip; s/p Left THA  Admission Diagnoses:  Past Medical History:  Diagnosis Date   Arthritis    Atrial fibrillation ([place]) 2015   Cancer ([place])    hx of skin cancer   Diabetes mellitus without complication (HCC)    Hyperlipidemia    Hypertension    OSA (obstructive sleep apnea) 2017   no cpap   PONV (postoperative nausea and vomiting)    at age 80 when had appendex out   Discharge Diagnoses:   Principal Problem:   OA (osteoarthritis) of hip Active Problems:   Primary osteoarthritis of left hip  Estimated body mass index is 31.32 kg/m as calculated from the following:   Height as of this encounter: 5' 6.5" (1.689 m).   Weight as of this encounter: 89.4 kg.  Procedure:  Procedure(s) (LRB): TOTAL HIP ARTHROPLASTY ANTERIOR APPROACH (Left)   Consults: None  HPI: Colleen Mcgrath is a 80 y.o. female who has advanced end-  stage arthritis of their Left  hip with progressively worsening pain and  dysfunction.The patient has failed nonoperative management and presents for  total hip arthroplasty.   Laboratory Data: Admission on 07/13/2021, Discharged on 07/15/2021  Component Date Value Ref Range Status   ABO/RH(D) 07/13/2021    Final                   Value:O POS Performed at [facility] [address] [place], [place] [address]    Glucose-Capillary 07/13/2021 186 (A) 70 - 99 mg/dL Final   Glucose reference range applies only to samples taken after fasting for at least 8 hours.   Glucose-Capillary 07/13/2021 187 (A) 70 - 99 mg/dL Final   Glucose reference range applies only to samples taken after fasting for at least 8 hours.   Glucose-Capillary 07/13/2021 222 (A) 70 - 99 mg/dL Final   Glucose reference range applies only to samples taken after fasting for at  least 8 hours.   WBC 07/14/2021 15.5 (A) 4.0 - 10.5 K/uL Final   RBC 07/14/2021 3.64 (A) 3.87 - 5.11 MIL/uL Final   Hemoglobin 07/14/2021 11.4 (A) 12.0 - 15.0 g/dL Final   HCT 07/14/2021 34.9 (A) 36.0 - 46.0 % Final   MCV 07/14/2021 95.9  80.0 - 100.0 fL Final   MCH 07/14/2021 31.3  26.0 - 34.0 pg Final   MCHC 07/14/2021 32.7  30.0 - 36.0 g/dL Final   RDW 07/14/2021 13.4  11.5 - 15.5 % Final   Platelets 07/14/2021 320  150 - 400 K/uL Final   nRBC 07/14/2021 0.0  0.0 - 0.2 % Final   Performed at [facility], [place] [address] [place], [place] [address]   Sodium 07/14/2021 136  135 - 145 mmol/L Final   Potassium 07/14/2021 4.4  3.5 - 5.1 mmol/L Final   Chloride 07/14/2021 108  98 - 111 mmol/L Final   CO2 07/14/2021 23  22 - 32 mmol/L Final   Glucose, Bld 07/14/2021 190 (A) 70 - 99 mg/dL Final   Glucose reference range applies only to samples taken after fasting for at least 8 hours.   BUN 07/14/2021 20  8 - 23 mg/dL Final   Creatinine, Ser 07/14/2021 1.04 (A) 0.44 - 1.00 mg/dL Final   Calcium 07/14/2021 8.7 (A) 8.9 - 10.3 mg/dL Final   GFR, Estimated 07/14/2021 55 (  A) >60 mL/min Final   Comment: (NOTE) Calculated using the CKD-EPI Creatinine Equation (2021)    Anion gap 07/14/2021 5  5 - 15 Final   Performed at [facility], [place] [address] [place], [place] [address]   Glucose-Capillary 07/13/2021 260 (A) 70 - 99 mg/dL Final   Glucose reference range applies only to samples taken after fasting for at least 8 hours.   Glucose-Capillary 07/14/2021 161 (A) 70 - 99 mg/dL Final   Glucose reference range applies only to samples taken after fasting for at least 8 hours.   Glucose-Capillary 07/14/2021 240 (A) 70 - 99 mg/dL Final   Glucose reference range applies only to samples taken after fasting for at least 8 hours.   Glucose-Capillary 07/14/2021 276 (A) 70 - 99 mg/dL Final   Glucose reference range applies only to samples taken after fasting for at  least 8 hours.   WBC 07/15/2021 17.3 (A) 4.0 - 10.5 K/uL Final   RBC 07/15/2021 3.78 (A) 3.87 - 5.11 MIL/uL Final   Hemoglobin 07/15/2021 11.5 (A) 12.0 - 15.0 g/dL Final   HCT 07/15/2021 35.4 (A) 36.0 - 46.0 % Final   MCV 07/15/2021 93.7  80.0 - 100.0 fL Final   MCH 07/15/2021 30.4  26.0 - 34.0 pg Final   MCHC 07/15/2021 32.5  30.0 - 36.0 g/dL Final   RDW 07/15/2021 13.3  11.5 - 15.5 % Final   Platelets 07/15/2021 317  150 - 400 K/uL Final   nRBC 07/15/2021 0.0  0.0 - 0.2 % Final   Performed at [facility], [place] [address] [place], [place] [address]   Sodium 07/15/2021 136  135 - 145 mmol/L Final   Potassium 07/15/2021 4.6  3.5 - 5.1 mmol/L Final   Chloride 07/15/2021 108  98 - 111 mmol/L Final   CO2 07/15/2021 22  22 - 32 mmol/L Final   Glucose, Bld 07/15/2021 176 (A) 70 - 99 mg/dL Final   Glucose reference range applies only to samples taken after fasting for at least 8 hours.   BUN 07/15/2021 14  8 - 23 mg/dL Final   Creatinine, Ser 07/15/2021 1.00  0.44 - 1.00 mg/dL Final   Calcium 07/15/2021 9.3  8.9 - 10.3 mg/dL Final   GFR, Estimated 07/15/2021 58 (A) >60 mL/min Final   Comment: (NOTE) Calculated using the CKD-EPI Creatinine Equation (2021)    Anion gap 07/15/2021 6  5 - 15 Final   Performed at [facility], [place] [address] [place], [place] [address]   Glucose-Capillary 07/14/2021 165 (A) 70 - 99 mg/dL Final   Glucose reference range applies only to samples taken after fasting for at least 8 hours.   Glucose-Capillary 07/15/2021 147 (A) 70 - 99 mg/dL Final   Glucose reference range applies only to samples taken after fasting for at least 8 hours.   Glucose-Capillary 07/15/2021 191 (A) 70 - 99 mg/dL Final   Glucose reference range applies only to samples taken after fasting for at least 8 hours.  Orders Only on 07/11/2021  Component Date Value Ref Range Status   SARS Coronavirus 2 07/11/2021 RESULT: NEGATIVE   Final   Comment: RESULT:  NEGATIVESARS-CoV-2 INTERPRETATION:A NEGATIVE  test result means that SARS-CoV-2 RNA was not present in the specimen above the limit of detection of this test. This does not preclude a possible SARS-CoV-2 infection and should not be used as the  sole basis for patient management decisions. Negative results must be combined with clinical  observations, patient history, and epidemiological information. Optimum specimen types and timing for peak viral levels during infections caused by SARS-CoV-2  have not been determined. Collection of multiple specimens or types of specimens may be necessary to detect virus. Improper specimen collection and handling, sequence variability under primers/probes, or organism present below the limit of detection may  lead to false negative results. Positive and negative predictive values of testing are highly dependent on prevalence. False negative test results are more likely when prevalence of disease is high.The expected result is NEGATIVE.Fact S                          heet for  Healthcare Providers: LocalChronicle.no Sheet for Patients: SalonLookup.es Reference Range - Negative   Hospital Outpatient Visit on 07/04/2021  Component Date Value Ref Range Status   WBC 07/04/2021 9.6  4.0 - 10.5 K/uL Final   RBC 07/04/2021 4.07  3.87 - 5.11 MIL/uL Final   Hemoglobin 07/04/2021 12.7  12.0 - 15.0 g/dL Final   HCT 07/04/2021 39.2  36.0 - 46.0 % Final   MCV 07/04/2021 96.3  80.0 - 100.0 fL Final   MCH 07/04/2021 31.2  26.0 - 34.0 pg Final   MCHC 07/04/2021 32.4  30.0 - 36.0 g/dL Final   RDW 07/04/2021 13.5  11.5 - 15.5 % Final   Platelets 07/04/2021 374  150 - 400 K/uL Final   nRBC 07/04/2021 0.0  0.0 - 0.2 % Final   Performed at [facility], [place] [address] [place], [place] [address]   Sodium 07/04/2021 139  135 - 145 mmol/L Final   Potassium 07/04/2021 4.7  3.5 - 5.1 mmol/L Final   Chloride  07/04/2021 110  98 - 111 mmol/L Final   CO2 07/04/2021 22  22 - 32 mmol/L Final   Glucose, Bld 07/04/2021 173 (A) 70 - 99 mg/dL Final   Glucose reference range applies only to samples taken after fasting for at least 8 hours.   BUN 07/04/2021 25 (A) 8 - 23 mg/dL Final   Creatinine, Ser 07/04/2021 0.99  0.44 - 1.00 mg/dL Final   Calcium 07/04/2021 9.4  8.9 - 10.3 mg/dL Final   Total Protein 07/04/2021 7.0  6.5 - 8.1 g/dL Final   Albumin 07/04/2021 4.1  3.5 - 5.0 g/dL Final   AST 07/04/2021 19  15 - 41 U/L Final   ALT 07/04/2021 17  0 - 44 U/L Final   Alkaline Phosphatase 07/04/2021 61  38 - 126 U/L Final   Total Bilirubin 07/04/2021 0.6  0.3 - 1.2 mg/dL Final   GFR, Estimated 07/04/2021 58 (A) >60 mL/min Final   Comment: (NOTE) Calculated using the CKD-EPI Creatinine Equation (2021)    Anion gap 07/04/2021 7  5 - 15 Final   Performed at [facility], [place] [address] [place], [place] [address]   ABO/RH(D) 07/04/2021 O POS   Final   Antibody Screen 07/04/2021 NEG   Final   Sample Expiration 07/04/2021 07/16/2021,2359   Final   Extend sample reason 07/04/2021    Final                   Value:NO TRANSFUSIONS OR PREGNANCY IN THE PAST 3 MONTHS Performed at [facility], [place] [address] [place], [place] [address]    Prothrombin Time 07/04/2021 20.5 (A) 11.4 - 15.2 seconds Final   INR 07/04/2021 1.8 (A) 0.8 - 1.2 Final   Comment: (NOTE)  INR goal varies based on device and disease states. Performed at [facility], [place] [address] [place], [place] [address]    MRSA, PCR 07/04/2021 NEGATIVE  NEGATIVE Final   Staphylococcus aureus 07/04/2021 NEGATIVE  NEGATIVE Final   Comment: (NOTE) The Xpert SA Assay (FDA approved for NASAL specimens in patients 75 years of age and older), is one component of a comprehensive surveillance program. It is not intended to diagnose infection nor to guide or monitor treatment. Performed at [facility], [place] [address], [place], [place] [address]    Hgb A1c MFr Bld 07/04/2021 6.9 (A) 4.8 - 5.6 % Final   Comment: (NOTE) Pre diabetes:          5.7%-6.4%  Diabetes:              >6.4%  Glycemic control for   <7.0% adults with diabetes    Mean Plasma Glucose 07/04/2021 151.33  mg/dL Final   Performed at [facility], [place] [address], [place], [place] [address]   Glucose-Capillary 07/04/2021 162 (A) 70 - 99 mg/dL Final   Glucose reference range applies only to samples taken after fasting for at least 8 hours.     X-Rays:DG Pelvis Portable  Result Date: 07/13/2021 CLINICAL DATA:  Left hip replacement. EXAM: PORTABLE PELVIS 1-2 VIEWS COMPARISON:  Same day. FINDINGS: Left hip arthroplasty. Subcutaneous and joint air present. Femoral stem appears well seated. Right hip joint space is maintained. Sacroiliac joints are patent. Degenerative changes in the lower lumbar spine. IMPRESSION: Left hip arthroplasty with expected postoperative findings. Electronically Signed   By: Lorin Picket M.D.   On: 07/13/2021 11:38   DG C-Arm 1-60 Min-No Report  Result Date: 07/13/2021 Fluoroscopy was utilized by the requesting physician.  No radiographic interpretation.   DG HIP OPERATIVE UNILAT W OR W/O PELVIS LEFT  Result Date: 07/13/2021 CLINICAL DATA:  Left hip replacement EXAM: OPERATIVE LEFT HIP (WITH PELVIS IF PERFORMED) 1 VIEWS TECHNIQUE: Fluoroscopic spot image(s) were submitted for interpretation post-operatively. COMPARISON:  None. FINDINGS: Two images are submitted in the frontal projection showing a total hip arthroplasty that is located and well seated. No evidence of intraoperative fracture. IMPRESSION: Fluoroscopy for left hip arthroplasty.  No unexpected finding. Electronically Signed   By: Jorje Guild M.D.   On: 07/13/2021 10:27    EKG: Orders placed or performed during the hospital encounter of 07/04/21   EKG 12 lead per protocol   EKG 12 lead per protocol      Hospital Course: Colleen Mcgrath is a 80 y.o. who was admitted to [facility]. They were brought to the operating room on 07/13/2021 and underwent Procedure(s): [place].  Patient tolerated the procedure well and was later transferred to the recovery room and then to the orthopaedic floor for postoperative care. They were given PO and IV analgesics for pain control following their surgery. They were given 24 hours of postoperative antibiotics of  Anti-infectives (From admission, onward)    Start     Dose/Rate Route Frequency Ordered Stop   07/13/21 2045  vancomycin (VANCOCIN) IVPB 1000 mg/200 mL premix        1,000 mg 200 mL/hr over 60 Minutes Intravenous Every 12 hours 07/13/21 1401 07/13/21 2153   07/13/21 0645  vancomycin (VANCOCIN) IVPB 1000 mg/200 mL premix        1,000 mg 200 mL/hr over 60 Minutes Intravenous On call to O.R. 07/13/21 [id number] 07/13/21 0940  and started on DVT prophylaxis in the form of Xarelto.   PT and OT were ordered for total joint protocol. Discharge planning consulted to help with postop disposition and equipment needs.  Patient had an uneventful night on the evening of surgery. They started to get up OOB with therapy on 07/14/2021. Pt was seen during rounds and was ready to go home pending progress with therapy. She worked with therapy on POD #1 and was not meeting goals. She then worked with therapy on POD#2 for two sessions, and was meeting goals. Pt was discharged to home later that day in stable condition.  Diet: Regular diet Activity: WBAT Follow-up: in two weeks Disposition: Home Discharged Condition: good   Discharge Instructions     Call MD / Call 911   Complete by: As directed    If you experience chest pain or shortness of breath, CALL 911 and be transported to the hospital emergency room.  If you develope a fever above 101 F, pus (white drainage) or increased drainage or redness at the wound, or calf pain, call  your surgeon's office.   Change dressing   Complete by: As directed    You have an adhesive waterproof bandage over the incision. Leave this in place until your first follow-up appointment. Once you remove this you will not need to place another bandage.   Constipation Prevention   Complete by: As directed    Drink plenty of fluids.  Prune juice may be helpful.  You may use a stool softener, such as Colace (over the counter) 100 mg twice a day.  Use MiraLax (over the counter) for constipation as needed.   Diet - low sodium heart healthy   Complete by: As directed    Do not sit on low chairs, stoools or toilet seats, as it may be difficult to get up from low surfaces   Complete by: As directed    Driving restrictions   Complete by: As directed    No driving for two weeks   Post-operative opioid taper instructions:   Complete by: As directed    POST-OPERATIVE OPIOID TAPER INSTRUCTIONS: It is important to wean off of your opioid medication as soon as possible. If you do not need pain medication after your surgery it is ok to stop day one. Opioids include: Codeine, Hydrocodone(Norco, Vicodin), Oxycodone(Percocet, oxycontin) and hydromorphone amongst others.  Long term and even short term use of opiods can cause: Increased pain response Dependence Constipation Depression Respiratory depression And more.  Withdrawal symptoms can include Flu like symptoms Nausea, vomiting And more Techniques to manage these symptoms Hydrate well Eat regular healthy meals Stay active Use relaxation techniques(deep breathing, meditating, yoga) Do Not substitute Alcohol to help with tapering If you have been on opioids for less than two weeks and do not have pain than it is ok to stop all together.  Plan to wean off of opioids This plan should start within one week post op of your joint replacement. Maintain the same interval or time between taking each dose and first decrease the dose.  Cut the total  daily intake of opioids by one tablet each day Next start to increase the time between doses. The last dose that should be eliminated is the evening dose.      TED hose   Complete by: As directed    Use stockings (TED hose) for three weeks on both leg(s).  You may remove them at night for sleeping.   Weight bearing as  tolerated   Complete by: As directed       Allergies as of 07/15/2021       Reactions   Colesevelam Hcl    gi   Ezetimibe    Redness in the face   Statins    cramps   Cephalexin Rash   Cephalosporins Rash   Sulfa Antibiotics Rash   Tetracyclines & Related Rash        Medication List     TAKE these medications    acetaminophen 650 MG CR tablet Commonly known as: TYLENOL Take 650 mg by mouth every 8 (eight) hours as needed for pain.   Cholecalciferol 25 MCG (1000 UT) tablet Take 1,000 Units by mouth daily.   fenofibrate 160 MG tablet Take 160 mg by mouth daily.   glucose blood test strip TEST ONCE DAILY   insulin detemir 100 UNIT/ML FlexPen Commonly known as: LEVEMIR Inject 45 Units into the skin at bedtime.   Insulin Pen Needle 32G X 4 MM Misc Use 1 pen needle daily.E11.9   metFORMIN 500 MG 24 hr tablet Commonly known as: GLUCOPHAGE-XR Take 500 mg by mouth 2 (two) times daily.   methocarbamol 500 MG tablet Commonly known as: ROBAXIN Take 1 tablet (500 mg total) by mouth every 6 (six) hours as needed for muscle spasms.   metoprolol succinate 100 MG 24 hr tablet Commonly known as: TOPROL-XL Take 100 mg by mouth daily.   rivaroxaban 20 MG Tabs tablet Commonly known as: XARELTO Take 1 tablet (20 mg total) by mouth daily with supper.   traMADol 50 MG tablet Commonly known as: ULTRAM Take 1-2 tablets (50-100 mg total) by mouth every 6 (six) hours as needed for moderate pain.               Discharge Care Instructions  (From admission, onward)           Start     Ordered   07/14/21 0000  Weight bearing as tolerated         07/14/21 0756   07/14/21 0000  Change dressing       Comments: You have an adhesive waterproof bandage over the incision. Leave this in place until your first follow-up appointment. Once you remove this you will not need to place another bandage.   07/14/21 0756            Follow-up Information     Gaynelle Arabian, MD. Schedule an appointment as soon as possible for a visit on 07/26/2021.   Specialty: Orthopedic Surgery Contact information: [address] [place] [address] [phone]                 Signed: Fenton Foy, MBA, PA-C Orthopedic Surgery 07/20/2021, 10:42 AM

## 2023-01-02 ENCOUNTER — Other Ambulatory Visit: Payer: Self-pay

## 2023-01-02 ENCOUNTER — Ambulatory Visit: Payer: Medicare Other | Attending: Orthopedic Surgery

## 2023-01-02 DIAGNOSIS — Z96642 Presence of left artificial hip joint: Secondary | ICD-10-CM | POA: Insufficient documentation

## 2023-01-02 DIAGNOSIS — R2689 Other abnormalities of gait and mobility: Secondary | ICD-10-CM | POA: Insufficient documentation

## 2023-01-02 DIAGNOSIS — R262 Difficulty in walking, not elsewhere classified: Secondary | ICD-10-CM | POA: Insufficient documentation

## 2023-01-02 DIAGNOSIS — R29898 Other symptoms and signs involving the musculoskeletal system: Secondary | ICD-10-CM | POA: Diagnosis present

## 2023-01-02 NOTE — Therapy (Signed)
OUTPATIENT PHYSICAL THERAPY LOWER EXTREMITY EVALUATION   Patient Name: Colleen Mcgrath MRN: [id number] DOB:1941-07-09, 82 y.o., female Today's Date: 01/02/2023  END OF SESSION:  PT End of Session - 01/02/23 1729     Visit Number 1    Date for PT Re-Evaluation 02/27/23    PT Start Time 1622    PT Stop Time 1702    PT Time Calculation (min) 40 min    Activity Tolerance Patient tolerated treatment well    Behavior During Therapy WFL for tasks assessed/performed             Past Medical History:  Diagnosis Date   Arthritis    Atrial fibrillation ([place]) 2015   Cancer ([place])    hx of skin cancer   Diabetes mellitus without complication ([place])    Hyperlipidemia    Hypertension    OSA (obstructive sleep apnea) 2017   no cpap   PONV (postoperative nausea and vomiting)    at age 28 when had appendex out   Past Surgical History:  Procedure Laterality Date   [place] Left 07/13/2021   Procedure: TOTAL HIP ARTHROPLASTY ANTERIOR APPROACH;  Surgeon: Gaynelle Arabian, MD;  Location: [facility];  Service: Orthopedics;  Laterality: Left;   Patient Active Problem List   Diagnosis Date Noted   OA (osteoarthritis) of hip 07/13/2021   Primary osteoarthritis of left hip 07/13/2021   Longstanding persistent atrial fibrillation ([place]) 06/04/2018   Essential hypertension 06/04/2018   Diabetes mellitus type 2 in obese ([place]) 06/04/2018   Mixed hyperlipidemia 06/04/2018   OSA (obstructive sleep apnea) 06/04/2018    PCP: Beckie Salts, MD  REFERRING PROVIDER: Hector Shade, MD  REFERRING DIAG: gait instability  THERAPY DIAG:  Difficulty walking  Weakness of left hip  Balance problem  History of left hip replacement  Rationale for Evaluation and Treatment: Rehabilitation  ONSET DATE: September 2023  SUBJECTIVE:   SUBJECTIVE STATEMENT: I had my hip replaced in September and since then I've  felt like I might lose my balance with certain things such as going up and down curbs, etc  PERTINENT HISTORY: L THA 6 months ago.  Also DM,  current recovering from UTI PAIN:  Are you having pain? No  PRECAUTIONS: Fall  WEIGHT BEARING RESTRICTIONS: No  FALLS:  Has patient fallen in last 6 months? No  LIVING ENVIRONMENT: Lives with: lives alone Lives in: House/apartment Stairs: No Has following equipment at home: Single point cane  OCCUPATION:retired  PLOF: Independent  PATIENT GOALS: to be able to return to walking, hiking, and to avoid falling  NEXT MD VISIT: 01/09/23  OBJECTIVE:   DIAGNOSTIC FINDINGS: na  PATIENT SURVEYS:  LEFS 53/80  COGNITION: Overall cognitive status: Within functional limits for tasks assessed     SENSATION: WFL   POSTURE: forward head  PALPATION: Tender L lateral hip over gr trochanter  LOWER EXTREMITY ROM: ALL LE ROM wnl B   LOWER EXTREMITY MMT:all wfl unless otherwise noted   MMT Right eval Left eval  Hip flexion 5 5  Hip extension 4 4-  Hip abduction 5 3  Hip adduction    Hip internal rotation    Hip external rotation    Knee flexion    Knee extension(locked bridge) 4 4  Ankle dorsiflexion    Ankle plantarflexion 3+ 3+  Ankle inversion    Ankle eversion  FUNCTIONAL TESTS:   [facility] PT Assessment - 01/02/23 0001       Dynamic Gait Index   Level Surface Mild Impairment    Change in Gait Speed Moderate Impairment    Gait with Horizontal Head Turns Severe Impairment    Gait with Vertical Head Turns Mild Impairment    Gait and Pivot Turn Moderate Impairment    Step Over Obstacle Mild Impairment    Step Around Obstacles Mild Impairment    Steps Severe Impairment    Total Score 10               GAIT: Distance walked: 14' at a time within clinic Assistive device utilized: None Level of assistance: Complete Independence Comments: decreased L arm swing, increased L lateral trunk sway with L sided weight  bearing, mild circumduction L LE , decreased L hip extension in terminal stance with shortened R stride length   TODAY'S TREATMENT:                                                                                                                              DATE: 01/02/23:  PT evaluation, completion of DGI and instructed in the 2 therex as indicated below: PATIENT EDUCATION:  Education details: POC, goals Person educated: Patient Education method: Explanation, Demonstration, Verbal cues, and Handouts Education comprehension: verbalized understanding and returned demonstration  HOME EXERCISE PROGRAM: Access Code: [email or web address] URL: [email or web address] Date: 01/02/2023 Prepared by: Warren Lacy Breslin Hemann  Exercises - Supine Bridge  - 1 x daily - 7 x weekly - 1 sets - 10 reps - Seated Hip Abduction with Resistance  - 1 x daily - 7 x weekly - 1 sets - 10 reps  ASSESSMENT:  CLINICAL IMPRESSION: Patient is a 82 y.o. female who was seen today for physical therapy evaluation and treatment for unsteady gait.  Underwent L. THA in September and has noted difficulty with uneven terrain, curbs, is fearful of going into the community much due to this unsteadiness.  Presents with strength deficits throughout B LE's but most notably L hip gluteus medius and L gluteus maximus.  Would benefit from skilled PT to address her strength deficits. Also balance training , and assistance with transition to a community exercise program.   OBJECTIVE IMPAIRMENTS: Abnormal gait, decreased activity tolerance, decreased balance, difficulty walking, decreased strength, and dizziness.   ACTIVITY LIMITATIONS: bending, squatting, stairs, and locomotion level  PARTICIPATION LIMITATIONS: shopping and community activity  PERSONAL FACTORS: Age, Time since onset of injury/illness/exacerbation, and 1-2 comorbidities: DM, h/o L THA, UTI  are also affecting patient's functional outcome.   REHAB POTENTIAL: Good  CLINICAL  DECISION MAKING: Stable/uncomplicated  EVALUATION COMPLEXITY: Low   GOALS: Goals reviewed with patient? Yes  SHORT TERM GOALS: Target date: 01/16/23 I HEP Baseline:initiated today Goal status: INITIAL    LONG TERM GOALS: Target date: 02/27/23  DGI improve to 20 for decreased fall risk Baseline: 10 Goal status: INITIAL  2.  Improve L hip  abduction strength to 4+/5 for improved gait pattern Baseline: 3 Goal status: INITIAL  3.  Improve strength for plantarflexors B and gluteus maximus B to 5/5 for improved stability for transfers, steps Baseline: 4-/5 Goal status: INITIAL   PLAN:  PT FREQUENCY: 1-2x/week  PT DURATION: 8 weeks  PLANNED INTERVENTIONS: Therapeutic exercises, Therapeutic activity, Neuromuscular re-education, Balance training, Gait training, Patient/Family education, Self Care, and Joint mobilization  PLAN FOR NEXT SESSION: progress with additional standing balance activities and progress with home program to improve strength L hip and B ankle plantarflexors.   Anyae Griffith L Juniper Snyders, PT 01/02/2023, 5:34 PM

## 2023-01-11 ENCOUNTER — Ambulatory Visit: Payer: Medicare Other

## 2023-01-11 DIAGNOSIS — R29898 Other symptoms and signs involving the musculoskeletal system: Secondary | ICD-10-CM

## 2023-01-11 DIAGNOSIS — R262 Difficulty in walking, not elsewhere classified: Secondary | ICD-10-CM

## 2023-01-11 DIAGNOSIS — Z96642 Presence of left artificial hip joint: Secondary | ICD-10-CM

## 2023-01-11 DIAGNOSIS — R2689 Other abnormalities of gait and mobility: Secondary | ICD-10-CM

## 2023-01-11 NOTE — Therapy (Signed)
OUTPATIENT PHYSICAL THERAPY TREATMENT   Patient Name: Colleen Mcgrath MRN: [id number] DOB:1941-05-09, 82 y.o., female Today's Date: 01/11/2023  END OF SESSION:  PT End of Session - 01/11/23 1110     Visit Number 2    Date for PT Re-Evaluation 03/27/23    PT Start Time 1103    PT Stop Time 1147    PT Time Calculation (min) 44 min    Activity Tolerance Patient tolerated treatment well    Behavior During Therapy WFL for tasks assessed/performed              Past Medical History:  Diagnosis Date   Arthritis    Atrial fibrillation ([place]) 2015   Cancer ([place])    hx of skin cancer   Diabetes mellitus without complication ([place])    Hyperlipidemia    Hypertension    OSA (obstructive sleep apnea) 2017   no cpap   PONV (postoperative nausea and vomiting)    at age 80 when had appendex out   Past Surgical History:  Procedure Laterality Date   [place] Left 07/13/2021   Procedure: TOTAL HIP ARTHROPLASTY ANTERIOR APPROACH;  Surgeon: Gaynelle Arabian, MD;  Location: [facility];  Service: Orthopedics;  Laterality: Left;   Patient Active Problem List   Diagnosis Date Noted   OA (osteoarthritis) of hip 07/13/2021   Primary osteoarthritis of left hip 07/13/2021   Longstanding persistent atrial fibrillation ([place]) 06/04/2018   Essential hypertension 06/04/2018   Diabetes mellitus type 2 in obese ([place]) 06/04/2018   Mixed hyperlipidemia 06/04/2018   OSA (obstructive sleep apnea) 06/04/2018    PCP: Beckie Salts, MD  REFERRING PROVIDER: Hector Shade, MD  REFERRING DIAG: gait instability  THERAPY DIAG:  Difficulty walking  Weakness of left hip  Balance problem  History of left hip replacement  Rationale for Evaluation and Treatment: Rehabilitation  ONSET DATE: September 2023  SUBJECTIVE:   SUBJECTIVE STATEMENT: Not bad today.  PERTINENT HISTORY: L THA 6 months ago.  Also DM,   current recovering from UTI PAIN:  Are you having pain? No  PRECAUTIONS: Fall  WEIGHT BEARING RESTRICTIONS: No  FALLS:  Has patient fallen in last 6 months? No  LIVING ENVIRONMENT: Lives with: lives alone Lives in: House/apartment Stairs: No Has following equipment at home: Single point cane  OCCUPATION:retired  PLOF: Independent  PATIENT GOALS: to be able to return to walking, hiking, and to avoid falling  NEXT MD VISIT: 01/09/23  OBJECTIVE:   DIAGNOSTIC FINDINGS: na  PATIENT SURVEYS:  LEFS 53/80  COGNITION: Overall cognitive status: Within functional limits for tasks assessed     SENSATION: WFL   POSTURE: forward head  PALPATION: Tender L lateral hip over gr trochanter  LOWER EXTREMITY ROM: ALL LE ROM wnl B   LOWER EXTREMITY MMT:all wfl unless otherwise noted   MMT Right eval Left eval  Hip flexion 5 5  Hip extension 4 4-  Hip abduction 5 3  Hip adduction    Hip internal rotation    Hip external rotation    Knee flexion    Knee extension(locked bridge) 4 4  Ankle dorsiflexion    Ankle plantarflexion 3+ 3+  Ankle inversion    Ankle eversion       FUNCTIONAL TESTS:       GAIT: Distance walked: 14' at a time within clinic Assistive device utilized: None Level of assistance: Complete Independence  Comments: decreased L arm swing, increased L lateral trunk sway with L sided weight bearing, mild circumduction L LE , decreased L hip extension in terminal stance with shortened R stride length   TODAY'S TREATMENT:                                                                                                                              DATE:  01/11/23 Nustep L4x37min Bridges 2x10 Standing L 4 way hip red TB x 10 Leg press 20# x 10 BLE: 10# x 10 R/L Standing heel raise 2x10 Tandem walk 4x15 ft R/L lateral step up 6' x 10 each  01/02/23:  PT evaluation, completion of DGI and instructed in the 2 therex as indicated below: PATIENT  EDUCATION:  Education details: POC, goals Person educated: Patient Education method: Consulting civil engineer, Demonstration, Verbal cues, and Handouts Education comprehension: verbalized understanding and returned demonstration  HOME EXERCISE PROGRAM: Access Code: [email or web address] URL: [email or web address] Date: 01/11/2023 Prepared by: Clarene Essex  Exercises - Supine Bridge  - 1 x daily - 7 x weekly - 1 sets - 10 reps - Seated Hip Abduction with Resistance  - 1 x daily - 7 x weekly - 1 sets - 10 reps - Standing Hip Abduction with Anchored Resistance  - 1 x daily - 7 x weekly - 2 sets - 10 reps - Standing Hip Extension with Anchored Resistance  - 1 x daily - 7 x weekly - 2 sets - 10 reps  ASSESSMENT:  CLINICAL IMPRESSION: Advanced patient through exercises to improve strength and endurance necessary to improve stability and to decrease risk for falls. Patient shows instability with tandem walk more LOB to L side. She was able to complete all interventions with no increased pain.  OBJECTIVE IMPAIRMENTS: Abnormal gait, decreased activity tolerance, decreased balance, difficulty walking, decreased strength, and dizziness.   ACTIVITY LIMITATIONS: bending, squatting, stairs, and locomotion level  PARTICIPATION LIMITATIONS: shopping and community activity  PERSONAL FACTORS: Age, Time since onset of injury/illness/exacerbation, and 1-2 comorbidities: DM, h/o L THA, UTI  are also affecting patient's functional outcome.   REHAB POTENTIAL: Good  CLINICAL DECISION MAKING: Stable/uncomplicated  EVALUATION COMPLEXITY: Low   GOALS: Goals reviewed with patient? Yes  SHORT TERM GOALS: Target date: 01/16/23 I HEP Baseline:initiated today Goal status: IN PROGRESS    LONG TERM GOALS: Target date: 02/27/23  DGI improve to 20 for decreased fall risk Baseline: 10 Goal status: IN PROGRESS  2.  Improve L hip abduction strength to 4+/5 for improved gait pattern Baseline: 3 Goal status: IN  PROGRESS  3.  Improve strength for plantarflexors B and gluteus maximus B to 5/5 for improved stability for transfers, steps Baseline: 4-/5 Goal status: IN PROGRESS   PLAN:  PT FREQUENCY: 1-2x/week  PT DURATION: 8 weeks  PLANNED INTERVENTIONS: Therapeutic exercises, Therapeutic activity, Neuromuscular re-education, Balance training, Gait training, Patient/Family education, Self Care, and Joint mobilization  PLAN FOR NEXT SESSION: progress with additional standing balance activities  and progress with home program to improve strength L hip and B ankle plantarflexors.   Artist Pais, PTA 01/11/2023, 11:54 AM

## 2023-01-16 ENCOUNTER — Ambulatory Visit: Payer: Medicare Other | Attending: Orthopedic Surgery

## 2023-01-16 DIAGNOSIS — R262 Difficulty in walking, not elsewhere classified: Secondary | ICD-10-CM | POA: Insufficient documentation

## 2023-01-16 DIAGNOSIS — Z96642 Presence of left artificial hip joint: Secondary | ICD-10-CM | POA: Diagnosis present

## 2023-01-16 DIAGNOSIS — R29898 Other symptoms and signs involving the musculoskeletal system: Secondary | ICD-10-CM | POA: Insufficient documentation

## 2023-01-16 DIAGNOSIS — R2689 Other abnormalities of gait and mobility: Secondary | ICD-10-CM | POA: Insufficient documentation

## 2023-01-16 NOTE — Therapy (Signed)
OUTPATIENT PHYSICAL THERAPY TREATMENT   Patient Name: Colleen Mcgrath MRN: [id number] DOB:April 11, 1941, 82 y.o., female Today's Date: 01/16/2023  END OF SESSION:  PT End of Session - 01/16/23 1453     Visit Number 3    Date for PT Re-Evaluation 03/27/23    PT Start Time [id number]    PT Stop Time 1442    PT Time Calculation (min) 40 min    Activity Tolerance Patient tolerated treatment well    Behavior During Therapy [facility] for tasks assessed/performed               Past Medical History:  Diagnosis Date   Arthritis    Atrial fibrillation 2015   Cancer    hx of skin cancer   Diabetes mellitus without complication    Hyperlipidemia    Hypertension    OSA (obstructive sleep apnea) 2017   no cpap   PONV (postoperative nausea and vomiting)    at age 75 when had appendex out   Past Surgical History:  Procedure Laterality Date   [place] Left 07/13/2021   Procedure: TOTAL HIP ARTHROPLASTY ANTERIOR APPROACH;  Surgeon: Gaynelle Arabian, MD;  Location: [facility];  Service: Orthopedics;  Laterality: Left;   Patient Active Problem List   Diagnosis Date Noted   OA (osteoarthritis) of hip 07/13/2021   Primary osteoarthritis of left hip 07/13/2021   Longstanding persistent atrial fibrillation 06/04/2018   Essential hypertension 06/04/2018   Diabetes mellitus type 2 in obese 06/04/2018   Mixed hyperlipidemia 06/04/2018   OSA (obstructive sleep apnea) 06/04/2018    PCP: Beckie Salts, MD  REFERRING PROVIDER: Hector Shade, MD  REFERRING DIAG: gait instability  THERAPY DIAG:  Difficulty walking  Weakness of left hip  Balance problem  History of left hip replacement  Rationale for Evaluation and Treatment: Rehabilitation  ONSET DATE: September 2023  SUBJECTIVE:   SUBJECTIVE STATEMENT: Doing good, feels more limber today.  PERTINENT HISTORY: L THA 6 months ago.  Also DM,  current  recovering from UTI PAIN:  Are you having pain? No  PRECAUTIONS: Fall  WEIGHT BEARING RESTRICTIONS: No  FALLS:  Has patient fallen in last 6 months? No  LIVING ENVIRONMENT: Lives with: lives alone Lives in: House/apartment Stairs: No Has following equipment at home: Single point cane  OCCUPATION:retired  PLOF: Independent  PATIENT GOALS: to be able to return to walking, hiking, and to avoid falling  NEXT MD VISIT: 01/09/23  OBJECTIVE:   DIAGNOSTIC FINDINGS: na  PATIENT SURVEYS:  LEFS 53/80  COGNITION: Overall cognitive status: Within functional limits for tasks assessed     SENSATION: WFL   POSTURE: forward head  PALPATION: Tender L lateral hip over gr trochanter  LOWER EXTREMITY ROM: ALL LE ROM wnl B   LOWER EXTREMITY MMT:all wfl unless otherwise noted   MMT Right eval Left eval  Hip flexion 5 5  Hip extension 4 4-  Hip abduction 5 3  Hip adduction    Hip internal rotation    Hip external rotation    Knee flexion    Knee extension(locked bridge) 4 4  Ankle dorsiflexion    Ankle plantarflexion 3+ 3+  Ankle inversion    Ankle eversion       FUNCTIONAL TESTS:       GAIT: Distance walked: 77' at a time within clinic Assistive device utilized: None Level of assistance: Complete Independence Comments:  decreased L arm swing, increased L lateral trunk sway with L sided weight bearing, mild circumduction L LE , decreased L hip extension in terminal stance with shortened R stride length   TODAY'S TREATMENT:                                                                                                                              DATE:  01/16/23 Therapeutic Exercise: to improve strength and mobility.  Demo, verbal and tactile cues throughout for technique.  Bike L2x38min Bridges with ball squeeze 2x10 S/L hip abduction x 10 bil Mini squat 10x3"  Tandem walk 2x76ft R/L SLS x 20 sec Clock balance RLE 1/2 circle 3x; LLE focus on posterior WS x  10 Toe taps alt on 6' step no UE support x 10 bil  01/11/23 Nustep L4x68min Bridges 2x10 Standing L 4 way hip red TB x 10 Leg press 20# x 10 BLE: 10# x 10 R/L Standing heel raise 2x10 Tandem walk 4x15 ft R/L lateral step up 6' x 10 each  01/02/23:  PT evaluation, completion of DGI and instructed in the 2 therex as indicated below: PATIENT EDUCATION:  Education details: POC, goals Person educated: Patient Education method: Explanation, Demonstration, Verbal cues, and Handouts Education comprehension: verbalized understanding and returned demonstration  HOME EXERCISE PROGRAM: Access Code: [email or web address] URL: [email or web address] Date: 01/11/2023 Prepared by: Clarene Essex  Exercises - Supine Bridge  - 1 x daily - 7 x weekly - 1 sets - 10 reps - Seated Hip Abduction with Resistance  - 1 x daily - 7 x weekly - 1 sets - 10 reps - Standing Hip Abduction with Anchored Resistance  - 1 x daily - 7 x weekly - 2 sets - 10 reps - Standing Hip Extension with Anchored Resistance  - 1 x daily - 7 x weekly - 2 sets - 10 reps  ASSESSMENT:  CLINICAL IMPRESSION: Advanced patient through exercises to improve strength and endurance necessary to improve stability and to decrease risk for falls. Also worked on neuro re-ed to emphasize proprioception and kinesthetic awareness to improve reactive responses to reduce reduce fall risk. Pt with many instances of LOB and close monitoring required with balance activities. She would continue to benefit form skilled therapy.   OBJECTIVE IMPAIRMENTS: Abnormal gait, decreased activity tolerance, decreased balance, difficulty walking, decreased strength, and dizziness.   ACTIVITY LIMITATIONS: bending, squatting, stairs, and locomotion level  PARTICIPATION LIMITATIONS: shopping and community activity  PERSONAL FACTORS: Age, Time since onset of injury/illness/exacerbation, and 1-2 comorbidities: DM, h/o L THA, UTI  are also affecting patient's functional  outcome.   REHAB POTENTIAL: Good  CLINICAL DECISION MAKING: Stable/uncomplicated  EVALUATION COMPLEXITY: Low   GOALS: Goals reviewed with patient? Yes  SHORT TERM GOALS: Target date: 01/16/23 I HEP Baseline:initiated today Goal status: MET    LONG TERM GOALS: Target date: 02/27/23  DGI improve to 20 for decreased fall risk Baseline: 10 Goal status: IN PROGRESS  2.  Improve L hip abduction strength to 4+/5 for improved gait pattern Baseline: 3 Goal status: IN PROGRESS  3.  Improve strength for plantarflexors B and gluteus maximus B to 5/5 for improved stability for transfers, steps Baseline: 4-/5 Goal status: IN PROGRESS   PLAN:  PT FREQUENCY: 1-2x/week  PT DURATION: 8 weeks  PLANNED INTERVENTIONS: Therapeutic exercises, Therapeutic activity, Neuromuscular re-education, Balance training, Gait training, Patient/Family education, Self Care, and Joint mobilization  PLAN FOR NEXT SESSION: progress with additional standing balance activities and progress with home program to improve strength L hip and B ankle plantarflexors.   Artist Pais, PTA 01/16/2023, 2:54 PM

## 2023-01-23 ENCOUNTER — Ambulatory Visit: Payer: Medicare Other

## 2023-01-23 DIAGNOSIS — R29898 Other symptoms and signs involving the musculoskeletal system: Secondary | ICD-10-CM

## 2023-01-23 DIAGNOSIS — R262 Difficulty in walking, not elsewhere classified: Secondary | ICD-10-CM

## 2023-01-23 DIAGNOSIS — R2689 Other abnormalities of gait and mobility: Secondary | ICD-10-CM

## 2023-01-23 DIAGNOSIS — Z96642 Presence of left artificial hip joint: Secondary | ICD-10-CM

## 2023-01-23 NOTE — Therapy (Signed)
OUTPATIENT PHYSICAL THERAPY TREATMENT   Patient Name: Colleen Mcgrath MRN: [id number] DOB:August 19, 1941, 82 y.o., female Today's Date: 01/23/2023  END OF SESSION:  PT End of Session - 01/23/23 1457     Visit Number 4    Date for PT Re-Evaluation 03/27/23    PT Start Time 1445    PT Stop Time 1530    PT Time Calculation (min) 45 min    Activity Tolerance Patient tolerated treatment well    Behavior During Therapy [facility] for tasks assessed/performed                Past Medical History:  Diagnosis Date   Arthritis    Atrial fibrillation 2015   Cancer    hx of skin cancer   Diabetes mellitus without complication    Hyperlipidemia    Hypertension    OSA (obstructive sleep apnea) 2017   no cpap   PONV (postoperative nausea and vomiting)    at age 61 when had appendex out   Past Surgical History:  Procedure Laterality Date   APPENDECTOMY  1965   CERVICAL SPINE SURGERY  2010   LUMBAR SPINE SURGERY  1992   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   Procedure: TOTAL HIP ARTHROPLASTY ANTERIOR APPROACH;  Surgeon: Ollen Gross, MD;  Location: [facility];  Service: Orthopedics;  Laterality: Left;   Patient Active Problem List   Diagnosis Date Noted   OA (osteoarthritis) of hip 07/13/2021   Primary osteoarthritis of left hip 07/13/2021   Longstanding persistent atrial fibrillation 06/04/2018   Essential hypertension 06/04/2018   Diabetes mellitus type 2 in obese 06/04/2018   Mixed hyperlipidemia 06/04/2018   OSA (obstructive sleep apnea) 06/04/2018    PCP: Patria Mane, MD  REFERRING PROVIDER: Trudee Grip, MD  REFERRING DIAG: gait instability  THERAPY DIAG:  Difficulty walking  Weakness of left hip  Balance problem  History of left hip replacement  Rationale for Evaluation and Treatment: Rehabilitation  ONSET DATE: September 2023  SUBJECTIVE:   SUBJECTIVE STATEMENT:  Muscle exhaustion for several days after last session.  Not sure why  PERTINENT HISTORY: L THA  6 months ago.  Also DM,  current recovering from UTI PAIN:  Are you having pain? No  PRECAUTIONS: Fall  WEIGHT BEARING RESTRICTIONS: No  FALLS:  Has patient fallen in last 6 months? No  LIVING ENVIRONMENT: Lives with: lives alone Lives in: House/apartment Stairs: No Has following equipment at home: Single point cane  OCCUPATION:retired  PLOF: Independent  PATIENT GOALS: to be able to return to walking, hiking, and to avoid falling  NEXT MD VISIT: 01/09/23  OBJECTIVE:   DIAGNOSTIC FINDINGS: na  PATIENT SURVEYS:  LEFS 53/80  COGNITION: Overall cognitive status: Within functional limits for tasks assessed     SENSATION: WFL   POSTURE: forward head  PALPATION: Tender L lateral hip over gr trochanter  LOWER EXTREMITY ROM: ALL LE ROM wnl B   LOWER EXTREMITY MMT:all wfl unless otherwise noted   MMT Right eval Left eval  Hip flexion 5 5  Hip extension 4 4-  Hip abduction 5 3  Hip adduction    Hip internal rotation    Hip external rotation    Knee flexion    Knee extension(locked bridge) 4 4  Ankle dorsiflexion    Ankle plantarflexion 3+ 3+  Ankle inversion    Ankle eversion       FUNCTIONAL TESTS:       GAIT: Distance walked: 37' at a time within clinic Assistive device  utilized: None Level of assistance: Complete Independence Comments: decreased L arm swing, increased L lateral trunk sway with L sided weight bearing, mild circumduction L LE , decreased L hip extension in terminal stance with shortened R stride length   TODAY'S TREATMENT:                                                                                                                              DATE:  01/23/23: Therapeutic exercise/ NMR: Bike level 2 x 6 min Standing for towel slides each foot, f/b, s/s, cw/ccw Knee extension B 5#, 2 sets 10 reps Gait in hallway, quick starts and stops with therapist's cues Gait gait in hall way with horizontal head turns Alternating toe  taps, coinciding with metranome at 60 for 30 sec  Lateral step downs from 2" step 15 reps each leg  01/16/23 Therapeutic Exercise: to improve strength and mobility.  Demo, verbal and tactile cues throughout for technique.  Bike L2x62min Bridges with ball squeeze 2x10 S/L hip abduction x 10 bil Mini squat 10x3"  Tandem walk 2x11ft R/L SLS x 20 sec Clock balance RLE 1/2 circle 3x; LLE focus on posterior WS x 10 Toe taps alt on 6' step no UE support x 10 bil  01/11/23 Nustep L4x70min Bridges 2x10 Standing L 4 way hip red TB x 10 Leg press 20# x 10 BLE: 10# x 10 R/L Standing heel raise 2x10 Tandem walk 4x15 ft R/L lateral step up 6' x 10 each  01/02/23:  PT evaluation, completion of DGI and instructed in the 2 therex as indicated below: PATIENT EDUCATION:  Education details: POC, goals Person educated: Patient Education method: Explanation, Demonstration, Verbal cues, and Handouts Education comprehension: verbalized understanding and returned demonstration  HOME EXERCISE PROGRAM: Access Code: [email or web address] URL: [email or web address] Date: 01/11/2023 Prepared by: Verta Ellen  Exercises - Supine Bridge  - 1 x daily - 7 x weekly - 1 sets - 10 reps - Seated Hip Abduction with Resistance  - 1 x daily - 7 x weekly - 1 sets - 10 reps - Standing Hip Abduction with Anchored Resistance  - 1 x daily - 7 x weekly - 2 sets - 10 reps - Standing Hip Extension with Anchored Resistance  - 1 x daily - 7 x weekly - 2 sets - 10 reps  ASSESSMENT:  CLINICAL IMPRESSION: Returned today for skilled PT to address her instability, gait, balance.  She fatigues fairly quickly.  Noted circumduction at times L LE , particularly with stops and starts.  Performing her ex at home as well.  Should benefit from ongoing skilled PT to address her gait, for fall prevention, and improve activity tolerance.  OBJECTIVE IMPAIRMENTS: Abnormal gait, decreased activity tolerance, decreased balance, difficulty  walking, decreased strength, and dizziness.   ACTIVITY LIMITATIONS: bending, squatting, stairs, and locomotion level  PARTICIPATION LIMITATIONS: shopping and community activity  PERSONAL FACTORS: Age, Time since onset of injury/illness/exacerbation, and 1-2 comorbidities: DM, h/o L  THA, UTI  are also affecting patient's functional outcome.   REHAB POTENTIAL: Good  CLINICAL DECISION MAKING: Stable/uncomplicated  EVALUATION COMPLEXITY: Low   GOALS: Goals reviewed with patient? Yes  SHORT TERM GOALS: Target date: 01/16/23 I HEP Baseline:initiated today Goal status: MET    LONG TERM GOALS: Target date: 02/27/23  DGI improve to 20 for decreased fall risk Baseline: 10 Goal status: IN PROGRESS  2.  Improve L hip abduction strength to 4+/5 for improved gait pattern Baseline: 3 Goal status: IN PROGRESS  3.  Improve strength for plantarflexors B and gluteus maximus B to 5/5 for improved stability for transfers, steps Baseline: 4-/5 Goal status: IN PROGRESS   PLAN:  PT FREQUENCY: 1-2x/week  PT DURATION: 8 weeks  PLANNED INTERVENTIONS: Therapeutic exercises, Therapeutic activity, Neuromuscular re-education, Balance training, Gait training, Patient/Family education, Self Care, and Joint mobilization  PLAN FOR NEXT SESSION: progress with additional standing balance activities and stability Chevez Sambrano L Kiley Torrence, PT 01/23/2023, 6:05 PM

## 2023-01-31 ENCOUNTER — Ambulatory Visit: Payer: Medicare Other

## 2023-02-06 ENCOUNTER — Ambulatory Visit: Payer: Medicare Other

## 2023-02-13 ENCOUNTER — Other Ambulatory Visit: Payer: Self-pay

## 2023-02-13 ENCOUNTER — Ambulatory Visit: Payer: Medicare Other

## 2023-02-13 DIAGNOSIS — R262 Difficulty in walking, not elsewhere classified: Secondary | ICD-10-CM

## 2023-02-13 DIAGNOSIS — Z96642 Presence of left artificial hip joint: Secondary | ICD-10-CM

## 2023-02-13 DIAGNOSIS — R2689 Other abnormalities of gait and mobility: Secondary | ICD-10-CM

## 2023-02-13 DIAGNOSIS — R29898 Other symptoms and signs involving the musculoskeletal system: Secondary | ICD-10-CM

## 2023-02-13 NOTE — Therapy (Signed)
OUTPATIENT PHYSICAL THERAPY TREATMENT   Patient Name: Giana Castner MRN: [id number] DOB:[birth year], 82 y.o., female Today's Date: 02/13/2023  END OF SESSION:  PT End of Session - 02/13/23 1114     Visit Number 5    Date for PT Re-Evaluation 03/27/23    PT Start Time 1106    PT Stop Time 1150    PT Time Calculation (min) 44 min    Activity Tolerance Patient tolerated treatment well    Behavior During Therapy WFL for tasks assessed/performed                 Past Medical History:  Diagnosis Date   Arthritis    Atrial fibrillation (HCC) 2015   Cancer (HCC)    hx of skin cancer   Diabetes mellitus without complication (HCC)    Hyperlipidemia    Hypertension    OSA (obstructive sleep apnea) 2017   no cpap   PONV (postoperative nausea and vomiting)    at age 7 when had appendex out   Past Surgical History:  Procedure Laterality Date   APPENDECTOMY  1965   CERVICAL SPINE SURGERY  2010   LUMBAR SPINE SURGERY  1992   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   Procedure: TOTAL HIP ARTHROPLASTY ANTERIOR APPROACH;  Surgeon: Ollen Gross, MD;  Location: [facility];  Service: Orthopedics;  Laterality: Left;   Patient Active Problem List   Diagnosis Date Noted   OA (osteoarthritis) of hip 07/13/2021   Primary osteoarthritis of left hip 07/13/2021   Longstanding persistent atrial fibrillation (HCC) 06/04/2018   Essential hypertension 06/04/2018   Diabetes mellitus type 2 in obese 06/04/2018   Mixed hyperlipidemia 06/04/2018   OSA (obstructive sleep apnea) 06/04/2018    PCP: Patria Mane, MD  REFERRING PROVIDER: Trudee Grip, MD  REFERRING DIAG: gait instability  THERAPY DIAG:  Difficulty walking  Weakness of left hip  Balance problem  History of left hip replacement  Rationale for Evaluation and Treatment: Rehabilitation  ONSET DATE: September 2023  SUBJECTIVE:   SUBJECTIVE STATEMENT:  I missed a couple of weeks, have been really washed out , don't know  if I had a bug or something  PERTINENT HISTORY: L THA 6 months ago.  Also DM,  current recovering from UTI PAIN:  Are you having pain? No  PRECAUTIONS: Fall  WEIGHT BEARING RESTRICTIONS: No  FALLS:  Has patient fallen in last 6 months? No  LIVING ENVIRONMENT: Lives with: lives alone Lives in: House/apartment Stairs: No Has following equipment at home: Single point cane  OCCUPATION:retired  PLOF: Independent  PATIENT GOALS: to be able to return to walking, hiking, and to avoid falling  NEXT MD VISIT: 01/09/23  OBJECTIVE:   DIAGNOSTIC FINDINGS: na  PATIENT SURVEYS:  LEFS 53/80  COGNITION: Overall cognitive status: Within functional limits for tasks assessed     SENSATION: WFL   POSTURE: forward head  PALPATION: Tender L lateral hip over gr trochanter  LOWER EXTREMITY ROM: ALL LE ROM wnl B   LOWER EXTREMITY MMT:all wfl unless otherwise noted   MMT Right eval Left eval  Hip flexion 5 5  Hip extension 4 4-  Hip abduction 5 3  Hip adduction    Hip internal rotation    Hip external rotation    Knee flexion    Knee extension(locked bridge) 4 4  Ankle dorsiflexion    Ankle plantarflexion 3+ 3+  Ankle inversion    Ankle eversion       FUNCTIONAL TESTS:  GAIT: Distance walked: 38' at a time within clinic Assistive device utilized: None Level of assistance: Complete Independence Comments: decreased L arm swing, increased L lateral trunk sway with L sided weight bearing, mild circumduction L LE , decreased L hip extension in terminal stance with shortened R stride length   TODAY'S TREATMENT:                                                                                                                              DATE:  02/13/23: Nustep level 4, Ue's and LE's, 6 min Side stepping along 10' counter, with green t band around thighs 6x Standing hip ext 2#, 15 x each  Heel/toe rocks on airex, holding  counter, 20 reps Seated Long arc quads  4# each leg, 2 x 15 reps High marches with 3 sec holds, along counter 4 laps   01/23/23: Therapeutic exercise/ NMR: Bike level 2 x 6 min Standing for towel slides each foot, f/b, s/s, cw/ccw Knee extension B 5#, 2 sets 10 reps Gait in hallway, quick starts and stops with therapist's cues Gait gait in hall way with horizontal head turns Alternating toe taps, coinciding with metranome at 60 for 30 sec  Lateral step downs from 2" step 15 reps each leg  01/16/23 Therapeutic Exercise: to improve strength and mobility.  Demo, verbal and tactile cues throughout for technique.  Bike L2x75min Bridges with ball squeeze 2x10 S/L hip abduction x 10 bil Mini squat 10x3"  Tandem walk 2x30ft R/L SLS x 20 sec Clock balance RLE 1/2 circle 3x; LLE focus on posterior WS x 10 Toe taps alt on 6' step no UE support x 10 bil  01/11/23 Nustep L4x33min Bridges 2x10 Standing L 4 way hip red TB x 10 Leg press 20# x 10 BLE: 10# x 10 R/L Standing heel raise 2x10 Tandem walk 4x15 ft R/L lateral step up 6' x 10 each  01/02/23:  PT evaluation, completion of DGI and instructed in the 2 therex as indicated below: PATIENT EDUCATION:  Education details: POC, goals Person educated: Patient Education method: Explanation, Demonstration, Verbal cues, and Handouts Education comprehension: verbalized understanding and returned demonstration  HOME EXERCISE PROGRAM: Access Code: [email or web address] URL: [email or web address] Date: 01/11/2023 Prepared by: Verta Ellen  Exercises - Supine Bridge  - 1 x daily - 7 x weekly - 1 sets - 10 reps - Seated Hip Abduction with Resistance  - 1 x daily - 7 x weekly - 1 sets - 10 reps - Standing Hip Abduction with Anchored Resistance  - 1 x daily - 7 x weekly - 2 sets - 10 reps - Standing Hip Extension with Anchored Resistance  - 1 x daily - 7 x weekly - 2 sets - 10 reps  ASSESSMENT:  CLINICAL IMPRESSION: Returned today for skilled PT to address her instability, gait,  balance. She has missed a few weeks due to illness.  We did not utilize any higher level gait and balance  activities today per her request. Primarily focused on strengthening and stability training LE's.  She is still experiencing double vision consistently, to obtain prism lenses this week.    Should benefit from ongoing skilled PT to address her gait, for fall prevention, and improve activity tolerance. Advised her to seek additional appts with her cardiologist due to her fatigue, consistent a fib  OBJECTIVE IMPAIRMENTS: Abnormal gait, decreased activity tolerance, decreased balance, difficulty walking, decreased strength, and dizziness.   ACTIVITY LIMITATIONS: bending, squatting, stairs, and locomotion level  PARTICIPATION LIMITATIONS: shopping and community activity  PERSONAL FACTORS: Age, Time since onset of injury/illness/exacerbation, and 1-2 comorbidities: DM, h/o L THA, UTI  are also affecting patient's functional outcome.   REHAB POTENTIAL: Good  CLINICAL DECISION MAKING: Stable/uncomplicated  EVALUATION COMPLEXITY: Low   GOALS: Goals reviewed with patient? Yes  SHORT TERM GOALS: Target date: 01/16/23 I HEP Baseline:initiated today Goal status: MET    LONG TERM GOALS: Target date: 02/27/23  DGI improve to 20 for decreased fall risk Baseline: 10 Goal status: IN PROGRESS  2.  Improve L hip abduction strength to 4+/5 for improved gait pattern Baseline: 3 Goal status: IN PROGRESS  3.  Improve strength for plantarflexors B and gluteus maximus B to 5/5 for improved stability for transfers, steps Baseline: 4-/5 Goal status: IN PROGRESS   PLAN:  PT FREQUENCY: 1-2x/week  PT DURATION: 8 weeks  PLANNED INTERVENTIONS: Therapeutic exercises, Therapeutic activity, Neuromuscular re-education, Balance training, Gait training, Patient/Family education, Self Care, and Joint mobilization  PLAN FOR NEXT SESSION: progress with additional standing balance activities and  stability Loann Chahal L Imo [place], PT 02/13/2023, 12:08 PM

## 2023-02-20 ENCOUNTER — Ambulatory Visit: Payer: Medicare Other | Attending: Orthopedic Surgery

## 2023-02-20 DIAGNOSIS — R262 Difficulty in walking, not elsewhere classified: Secondary | ICD-10-CM | POA: Diagnosis present

## 2023-02-20 DIAGNOSIS — Z96642 Presence of left artificial hip joint: Secondary | ICD-10-CM | POA: Diagnosis present

## 2023-02-20 DIAGNOSIS — R29898 Other symptoms and signs involving the musculoskeletal system: Secondary | ICD-10-CM | POA: Diagnosis present

## 2023-02-20 DIAGNOSIS — R2689 Other abnormalities of gait and mobility: Secondary | ICD-10-CM

## 2023-02-20 NOTE — Therapy (Signed)
OUTPATIENT PHYSICAL THERAPY TREATMENT   Patient Name: Colleen Mcgrath MRN: [id number] DOB:12/15/1940, 82 y.o., female Today's Date: 02/20/2023  END OF SESSION:  PT End of Session - 02/20/23 0942     Visit Number 6    Date for PT Re-Evaluation 03/27/23    PT Start Time 0934    PT Stop Time 1015    PT Time Calculation (min) 41 min    Activity Tolerance Patient tolerated treatment well    Behavior During Therapy [facility] for tasks assessed/performed                  Past Medical History:  Diagnosis Date   Arthritis    Atrial fibrillation (HCC) 2015   Cancer (HCC)    hx of skin cancer   Diabetes mellitus without complication (HCC)    Hyperlipidemia    Hypertension    OSA (obstructive sleep apnea) 2017   no cpap   PONV (postoperative nausea and vomiting)    at age 59 when had appendex out   Past Surgical History:  Procedure Laterality Date   APPENDECTOMY  1965   CERVICAL SPINE SURGERY  2010   LUMBAR SPINE SURGERY  1992   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   Procedure: TOTAL HIP ARTHROPLASTY ANTERIOR APPROACH;  Surgeon: Ollen Gross, MD;  Location: [facility];  Service: Orthopedics;  Laterality: Left;   Patient Active Problem List   Diagnosis Date Noted   OA (osteoarthritis) of hip 07/13/2021   Primary osteoarthritis of left hip 07/13/2021   Longstanding persistent atrial fibrillation (HCC) 06/04/2018   Essential hypertension 06/04/2018   Diabetes mellitus type 2 in obese 06/04/2018   Mixed hyperlipidemia 06/04/2018   OSA (obstructive sleep apnea) 06/04/2018    PCP: Patria Mane, MD  REFERRING PROVIDER: Trudee Grip, MD  REFERRING DIAG: gait instability  THERAPY DIAG:  Difficulty walking  Weakness of left hip  Balance problem  History of left hip replacement  Rationale for Evaluation and Treatment: Rehabilitation  ONSET DATE: September 2023  SUBJECTIVE:   SUBJECTIVE STATEMENT:  Pt expressing frustration about A fib she has been dealing with for a  while.  PERTINENT HISTORY: L THA 6 months ago.  Also DM,  current recovering from UTI PAIN:  Are you having pain? No  PRECAUTIONS: Fall  WEIGHT BEARING RESTRICTIONS: No  FALLS:  Has patient fallen in last 6 months? No  LIVING ENVIRONMENT: Lives with: lives alone Lives in: House/apartment Stairs: No Has following equipment at home: Single point cane  OCCUPATION:retired  PLOF: Independent  PATIENT GOALS: to be able to return to walking, hiking, and to avoid falling  NEXT MD VISIT: 01/09/23  OBJECTIVE:   DIAGNOSTIC FINDINGS: na  PATIENT SURVEYS:  LEFS 53/80  COGNITION: Overall cognitive status: Within functional limits for tasks assessed     SENSATION: WFL   POSTURE: forward head  PALPATION: Tender L lateral hip over gr trochanter  LOWER EXTREMITY ROM: ALL LE ROM wnl B   LOWER EXTREMITY MMT:all wfl unless otherwise noted   MMT Right eval Left eval Right 02/20/23 Left 02/20/23  Hip flexion 5 5    Hip extension 4 4- 4+ 4  Hip abduction 5 3  4   Hip adduction      Hip internal rotation      Hip external rotation      Knee flexion      Knee extension(locked bridge) 4 4    Ankle dorsiflexion      Ankle plantarflexion 3+ 3+  Ankle inversion      Ankle eversion         FUNCTIONAL TESTS:       GAIT: Distance walked: 30' at a time within clinic Assistive device utilized: None Level of assistance: Complete Independence Comments: decreased L arm swing, increased L lateral trunk sway with L sided weight bearing, mild circumduction L LE , decreased L hip extension in terminal stance with shortened R stride length   TODAY'S TREATMENT:                                                                                                                              DATE:  02/20/23 Nustep L5x51min Strength assessed  Sidesteps along balance beam 6x along counter Stepping onto balance disk and airex pad 6x  Side stepping with red TB at thighs 3x along  counter  02/13/23: Nustep level 4, Ue's and LE's, 6 min Side stepping along 10' counter, with green t band around thighs 6x Standing hip ext 2#, 15 x each  Heel/toe rocks on airex, holding  counter, 20 reps Seated Long arc quads 4# each leg, 2 x 15 reps High marches with 3 sec holds, along counter 4 laps   01/23/23: Therapeutic exercise/ NMR: Bike level 2 x 6 min Standing for towel slides each foot, f/b, s/s, cw/ccw Knee extension B 5#, 2 sets 10 reps Gait in hallway, quick starts and stops with therapist's cues Gait gait in hall way with horizontal head turns Alternating toe taps, coinciding with metranome at 60 for 30 sec  Lateral step downs from 2" step 15 reps each leg  01/16/23 Therapeutic Exercise: to improve strength and mobility.  Demo, verbal and tactile cues throughout for technique.  Bike L2x26min Bridges with ball squeeze 2x10 S/L hip abduction x 10 bil Mini squat 10x3"  Tandem walk 2x38ft R/L SLS x 20 sec Clock balance RLE 1/2 circle 3x; LLE focus on posterior WS x 10 Toe taps alt on 6' step no UE support x 10 bil  01/11/23 Nustep L4x3min Bridges 2x10 Standing L 4 way hip red TB x 10 Leg press 20# x 10 BLE: 10# x 10 R/L Standing heel raise 2x10 Tandem walk 4x15 ft R/L lateral step up 6' x 10 each  01/02/23:  PT evaluation, completion of DGI and instructed in the 2 therex as indicated below: PATIENT EDUCATION:  Education details: POC, goals Person educated: Patient Education method: Explanation, Demonstration, Verbal cues, and Handouts Education comprehension: verbalized understanding and returned demonstration  HOME EXERCISE PROGRAM: Access Code: [email or web address] URL: [email or web address] Date: 01/11/2023 Prepared by: Verta Ellen  Exercises - Supine Bridge  - 1 x daily - 7 x weekly - 1 sets - 10 reps - Seated Hip Abduction with Resistance  - 1 x daily - 7 x weekly - 1 sets - 10 reps - Standing Hip Abduction with Anchored Resistance  - 1 x daily  - 7 x weekly - 2 sets - 10 reps -  Standing Hip Extension with Anchored Resistance  - 1 x daily - 7 x weekly - 2 sets - 10 reps  ASSESSMENT:  CLINICAL IMPRESSION: Pt continues to have concerns about balance on uneven surfaces as well as frustration about a fib, which she will see cardiologist in a few weeks. She shows improved strength in hip ABD and ext B. Her biggest issue is balancing on uneven surfaces. Occasional LOB noted with side stepping and stepping onto compliant surfaces. Will have to assess for recert vs D/C next visit.  OBJECTIVE IMPAIRMENTS: Abnormal gait, decreased activity tolerance, decreased balance, difficulty walking, decreased strength, and dizziness.   ACTIVITY LIMITATIONS: bending, squatting, stairs, and locomotion level  PARTICIPATION LIMITATIONS: shopping and community activity  PERSONAL FACTORS: Age, Time since onset of injury/illness/exacerbation, and 1-2 comorbidities: DM, h/o L THA, UTI  are also affecting patient's functional outcome.   REHAB POTENTIAL: Good  CLINICAL DECISION MAKING: Stable/uncomplicated  EVALUATION COMPLEXITY: Low   GOALS: Goals reviewed with patient? Yes  SHORT TERM GOALS: Target date: 01/16/23 I HEP Baseline:initiated today Goal status: MET    LONG TERM GOALS: Target date: 02/27/23  DGI improve to 20 for decreased fall risk Baseline: 10 Goal status: IN PROGRESS  2.  Improve L hip abduction strength to 4+/5 for improved gait pattern Baseline: 3 Goal status: IN PROGRESS- 02/20/23  3.  Improve strength for plantarflexors B and gluteus maximus B to 5/5 for improved stability for transfers, steps Baseline: 4-/5 Goal status: IN PROGRESS   PLAN:  PT FREQUENCY: 1-2x/week  PT DURATION: 8 weeks  PLANNED INTERVENTIONS: Therapeutic exercises, Therapeutic activity, Neuromuscular re-education, Balance training, Gait training, Patient/Family education, Self Care, and Joint mobilization  PLAN FOR NEXT SESSION: progress with  additional standing balance activities and stability Darleene Cleaver, PTA 02/20/2023, 10:19 AM

## 2023-02-27 ENCOUNTER — Ambulatory Visit: Payer: Medicare Other

## 2023-09-26 IMAGING — XA DG HIP (WITH PELVIS) OPERATIVE*L*
1 series · 2 of 2 positions shown · non-contrast
Comparison: None.

CLINICAL DATA: Left hip replacement

EXAM:
OPERATIVE LEFT HIP (WITH PELVIS IF PERFORMED) 1 VIEWS
TECHNIQUE: Fluoroscopic spot image(s) were submitted for interpretation
post-operatively.

[Series 1: unknown protocol · 2 of 2 slices shown]
[im 1/2]
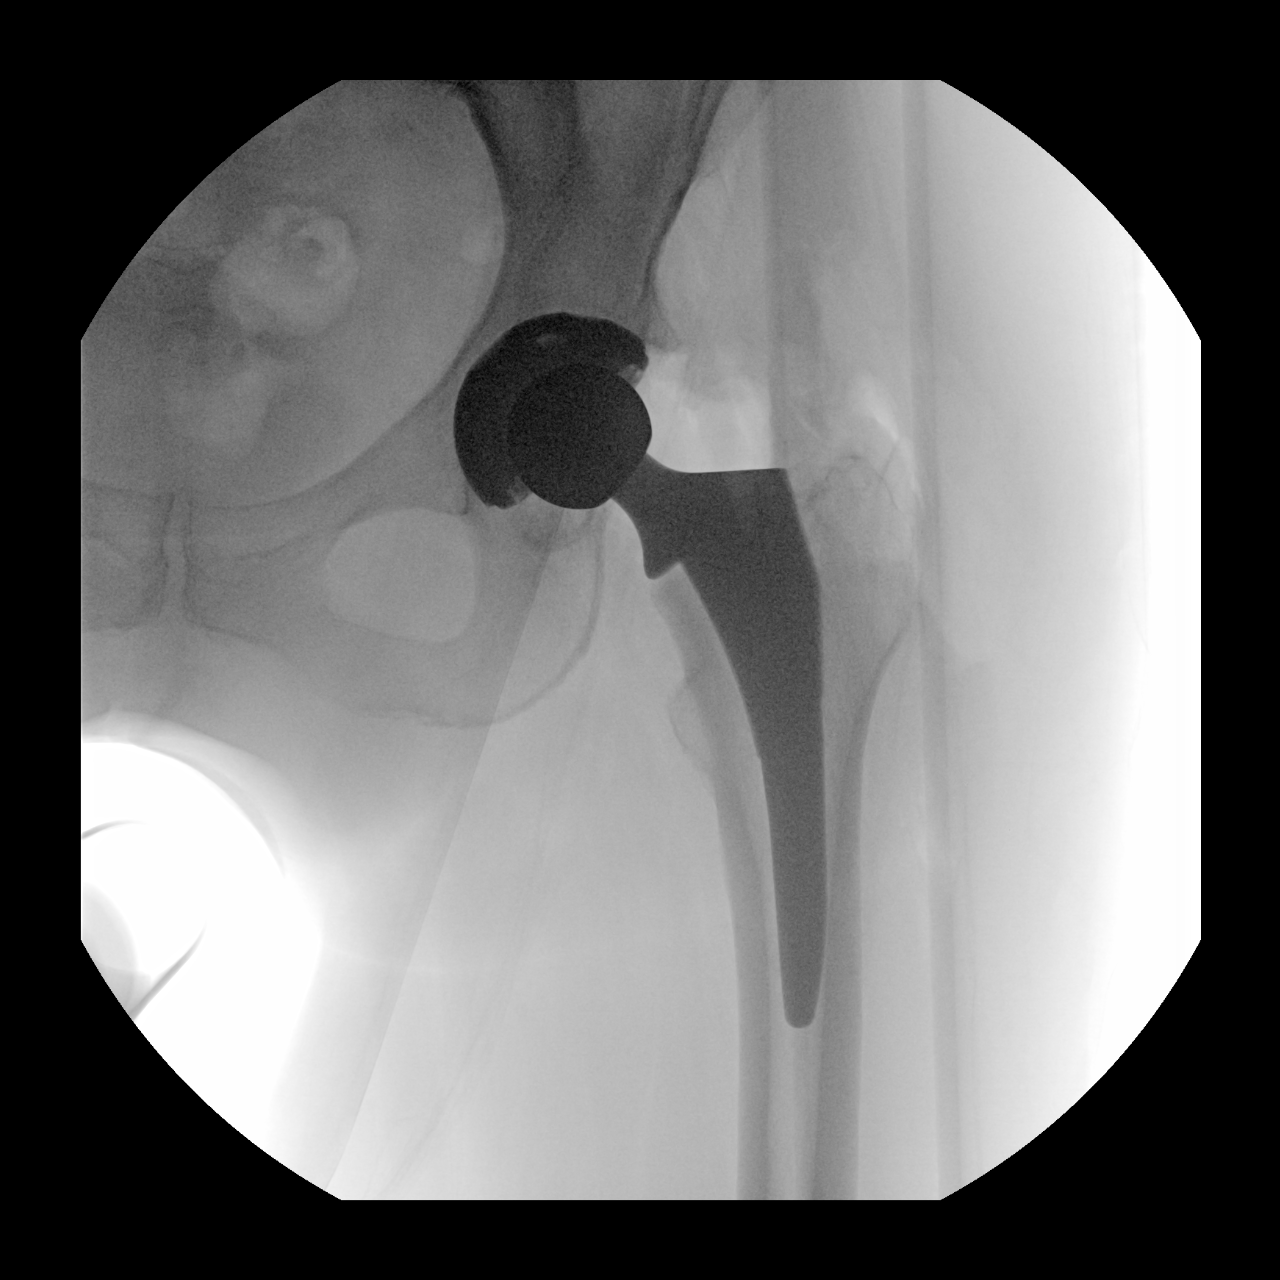
[im 2/2]
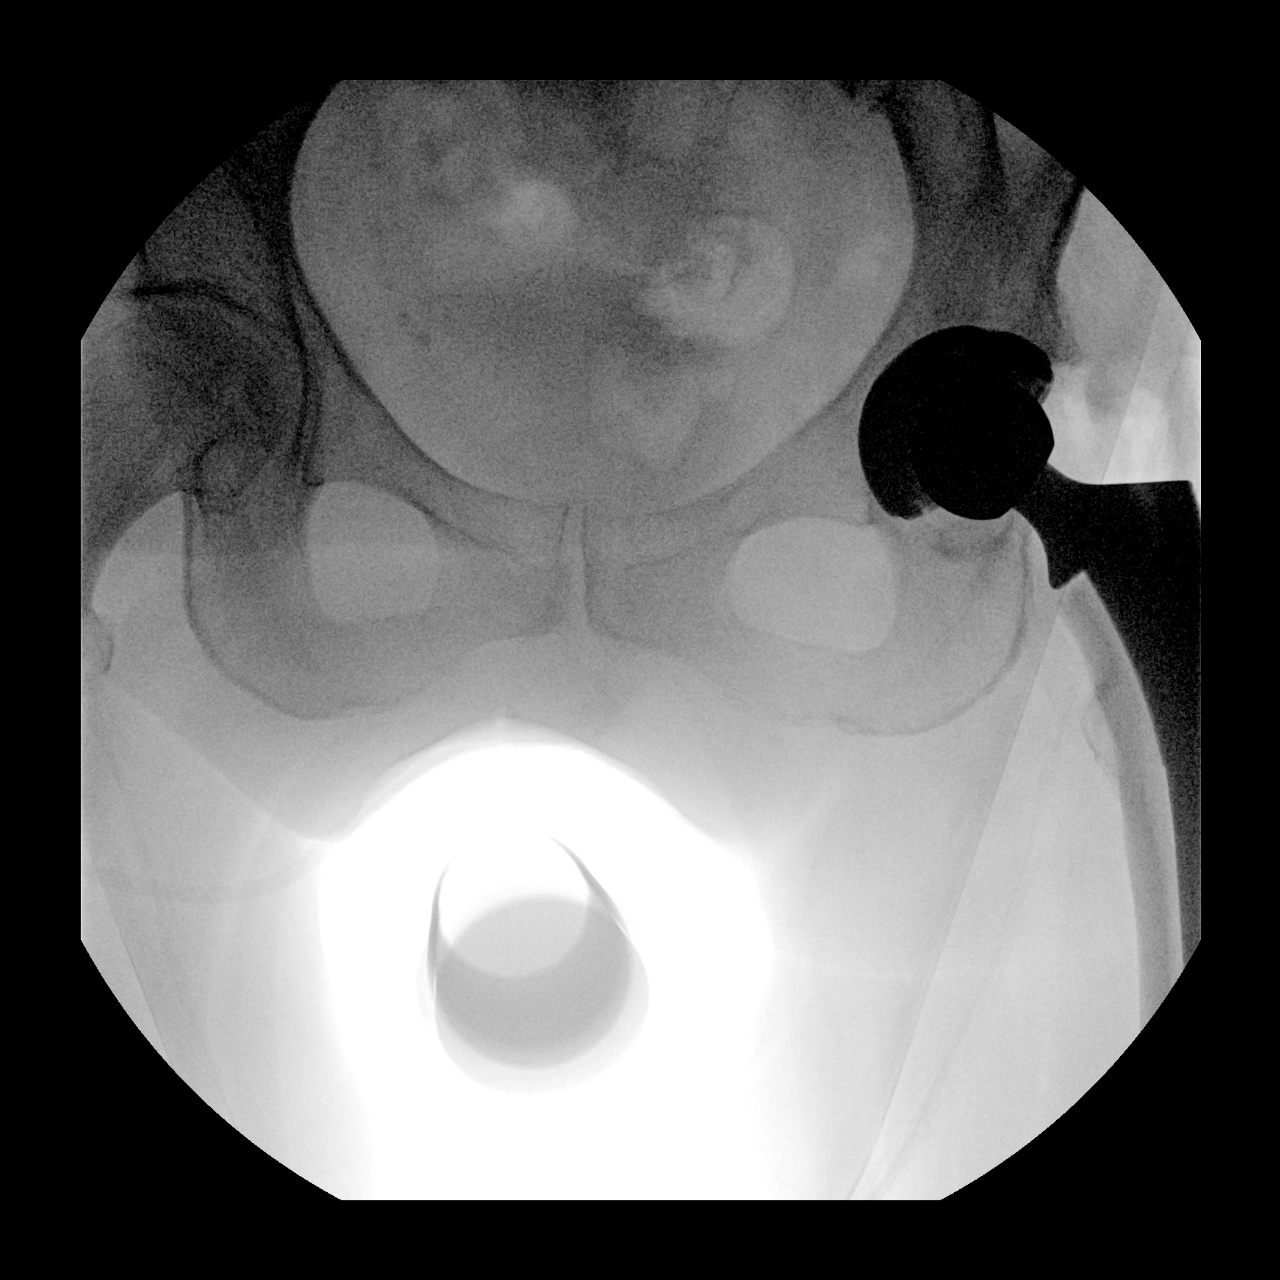

[2 of 2 positions shown; findings below may reference images not displayed]

FINDINGS: Two images are submitted in the frontal projection showing a total
hip arthroplasty that is located and well seated. No evidence of
intraoperative fracture.
IMPRESSION: Fluoroscopy for left hip arthroplasty.  No unexpected finding.
